# Patient Record
Sex: Male | Born: 1975 | Race: White | Hispanic: Yes | Marital: Single | Smoking: Former smoker
Health system: Southern US, Community
[De-identification: ages and names within clinical notes are randomized; demographics above are authoritative.]

## PROBLEM LIST (undated history)

## (undated) DIAGNOSIS — F419 Anxiety disorder, unspecified: Secondary | ICD-10-CM

## (undated) DIAGNOSIS — F39 Unspecified mood [affective] disorder: Secondary | ICD-10-CM

## (undated) DIAGNOSIS — F32A Depression, unspecified: Secondary | ICD-10-CM

## (undated) DIAGNOSIS — F329 Major depressive disorder, single episode, unspecified: Secondary | ICD-10-CM

## (undated) HISTORY — DX: Depression, unspecified: F32.A

## (undated) HISTORY — PX: APPENDECTOMY: SHX54

## (undated) HISTORY — DX: Unspecified mood (affective) disorder: F39

## (undated) HISTORY — DX: Major depressive disorder, single episode, unspecified: F32.9

## (undated) HISTORY — DX: Anxiety disorder, unspecified: F41.9

---

## 2015-09-04 DIAGNOSIS — F329 Major depressive disorder, single episode, unspecified: Secondary | ICD-10-CM | POA: Diagnosis not present

## 2015-11-20 DIAGNOSIS — F329 Major depressive disorder, single episode, unspecified: Secondary | ICD-10-CM | POA: Diagnosis not present

## 2016-02-15 DIAGNOSIS — F329 Major depressive disorder, single episode, unspecified: Secondary | ICD-10-CM | POA: Diagnosis not present

## 2016-02-20 DIAGNOSIS — F329 Major depressive disorder, single episode, unspecified: Secondary | ICD-10-CM | POA: Diagnosis not present

## 2016-04-16 DIAGNOSIS — F329 Major depressive disorder, single episode, unspecified: Secondary | ICD-10-CM | POA: Diagnosis not present

## 2016-04-18 DIAGNOSIS — E119 Type 2 diabetes mellitus without complications: Secondary | ICD-10-CM | POA: Diagnosis not present

## 2016-04-18 DIAGNOSIS — I10 Essential (primary) hypertension: Secondary | ICD-10-CM | POA: Diagnosis not present

## 2016-04-18 DIAGNOSIS — J309 Allergic rhinitis, unspecified: Secondary | ICD-10-CM | POA: Diagnosis not present

## 2016-04-18 DIAGNOSIS — D509 Iron deficiency anemia, unspecified: Secondary | ICD-10-CM | POA: Diagnosis not present

## 2016-06-17 DIAGNOSIS — F329 Major depressive disorder, single episode, unspecified: Secondary | ICD-10-CM | POA: Diagnosis not present

## 2016-07-07 DIAGNOSIS — Z9119 Patient's noncompliance with other medical treatment and regimen: Secondary | ICD-10-CM | POA: Diagnosis not present

## 2016-07-07 DIAGNOSIS — F250 Schizoaffective disorder, bipolar type: Secondary | ICD-10-CM | POA: Diagnosis not present

## 2016-07-07 DIAGNOSIS — F419 Anxiety disorder, unspecified: Secondary | ICD-10-CM | POA: Diagnosis not present

## 2016-07-18 DIAGNOSIS — F3112 Bipolar disorder, current episode manic without psychotic features, moderate: Secondary | ICD-10-CM | POA: Diagnosis not present

## 2016-07-26 DIAGNOSIS — Z9119 Patient's noncompliance with other medical treatment and regimen: Secondary | ICD-10-CM | POA: Diagnosis not present

## 2016-07-26 DIAGNOSIS — Z136 Encounter for screening for cardiovascular disorders: Secondary | ICD-10-CM | POA: Diagnosis not present

## 2016-07-26 DIAGNOSIS — F250 Schizoaffective disorder, bipolar type: Secondary | ICD-10-CM | POA: Diagnosis not present

## 2016-07-26 DIAGNOSIS — G4700 Insomnia, unspecified: Secondary | ICD-10-CM | POA: Diagnosis not present

## 2016-07-26 DIAGNOSIS — F419 Anxiety disorder, unspecified: Secondary | ICD-10-CM | POA: Diagnosis not present

## 2016-08-17 DIAGNOSIS — F209 Schizophrenia, unspecified: Secondary | ICD-10-CM | POA: Diagnosis not present

## 2016-08-17 DIAGNOSIS — Z9114 Patient's other noncompliance with medication regimen: Secondary | ICD-10-CM | POA: Diagnosis not present

## 2016-08-17 DIAGNOSIS — F250 Schizoaffective disorder, bipolar type: Secondary | ICD-10-CM | POA: Diagnosis present

## 2016-08-17 DIAGNOSIS — R824 Acetonuria: Secondary | ICD-10-CM | POA: Diagnosis not present

## 2016-09-09 DIAGNOSIS — F329 Major depressive disorder, single episode, unspecified: Secondary | ICD-10-CM | POA: Diagnosis not present

## 2016-09-10 DIAGNOSIS — F329 Major depressive disorder, single episode, unspecified: Secondary | ICD-10-CM | POA: Diagnosis not present

## 2016-09-15 DIAGNOSIS — F329 Major depressive disorder, single episode, unspecified: Secondary | ICD-10-CM | POA: Diagnosis not present

## 2016-09-19 DIAGNOSIS — F329 Major depressive disorder, single episode, unspecified: Secondary | ICD-10-CM | POA: Diagnosis not present

## 2016-09-22 DIAGNOSIS — F329 Major depressive disorder, single episode, unspecified: Secondary | ICD-10-CM | POA: Diagnosis not present

## 2016-09-23 DIAGNOSIS — F329 Major depressive disorder, single episode, unspecified: Secondary | ICD-10-CM | POA: Diagnosis not present

## 2016-09-25 DIAGNOSIS — F329 Major depressive disorder, single episode, unspecified: Secondary | ICD-10-CM | POA: Diagnosis not present

## 2016-09-26 DIAGNOSIS — F329 Major depressive disorder, single episode, unspecified: Secondary | ICD-10-CM | POA: Diagnosis not present

## 2016-09-29 DIAGNOSIS — F329 Major depressive disorder, single episode, unspecified: Secondary | ICD-10-CM | POA: Diagnosis not present

## 2016-10-01 DIAGNOSIS — F329 Major depressive disorder, single episode, unspecified: Secondary | ICD-10-CM | POA: Diagnosis not present

## 2016-10-03 DIAGNOSIS — F329 Major depressive disorder, single episode, unspecified: Secondary | ICD-10-CM | POA: Diagnosis not present

## 2016-10-06 DIAGNOSIS — F329 Major depressive disorder, single episode, unspecified: Secondary | ICD-10-CM | POA: Diagnosis not present

## 2016-10-13 DIAGNOSIS — F329 Major depressive disorder, single episode, unspecified: Secondary | ICD-10-CM | POA: Diagnosis not present

## 2016-10-15 DIAGNOSIS — F329 Major depressive disorder, single episode, unspecified: Secondary | ICD-10-CM | POA: Diagnosis not present

## 2016-10-16 DIAGNOSIS — F329 Major depressive disorder, single episode, unspecified: Secondary | ICD-10-CM | POA: Diagnosis not present

## 2016-10-30 DIAGNOSIS — F329 Major depressive disorder, single episode, unspecified: Secondary | ICD-10-CM | POA: Diagnosis not present

## 2017-01-02 DIAGNOSIS — F209 Schizophrenia, unspecified: Secondary | ICD-10-CM | POA: Diagnosis not present

## 2017-01-13 DIAGNOSIS — F250 Schizoaffective disorder, bipolar type: Secondary | ICD-10-CM | POA: Diagnosis not present

## 2017-01-13 DIAGNOSIS — E559 Vitamin D deficiency, unspecified: Secondary | ICD-10-CM | POA: Diagnosis not present

## 2017-01-13 DIAGNOSIS — Z79899 Other long term (current) drug therapy: Secondary | ICD-10-CM | POA: Diagnosis not present

## 2017-01-13 DIAGNOSIS — F209 Schizophrenia, unspecified: Secondary | ICD-10-CM | POA: Diagnosis not present

## 2017-01-13 DIAGNOSIS — F3112 Bipolar disorder, current episode manic without psychotic features, moderate: Secondary | ICD-10-CM | POA: Diagnosis not present

## 2017-01-15 DIAGNOSIS — F209 Schizophrenia, unspecified: Secondary | ICD-10-CM | POA: Diagnosis not present

## 2017-01-22 DIAGNOSIS — F209 Schizophrenia, unspecified: Secondary | ICD-10-CM | POA: Diagnosis not present

## 2017-01-28 DIAGNOSIS — F209 Schizophrenia, unspecified: Secondary | ICD-10-CM | POA: Diagnosis not present

## 2017-01-28 DIAGNOSIS — F250 Schizoaffective disorder, bipolar type: Secondary | ICD-10-CM | POA: Diagnosis not present

## 2017-02-19 DIAGNOSIS — F250 Schizoaffective disorder, bipolar type: Secondary | ICD-10-CM | POA: Diagnosis not present

## 2017-03-24 DIAGNOSIS — F250 Schizoaffective disorder, bipolar type: Secondary | ICD-10-CM | POA: Diagnosis not present

## 2017-04-17 DIAGNOSIS — F250 Schizoaffective disorder, bipolar type: Secondary | ICD-10-CM | POA: Diagnosis not present

## 2017-05-13 DIAGNOSIS — F250 Schizoaffective disorder, bipolar type: Secondary | ICD-10-CM | POA: Diagnosis not present

## 2017-06-10 DIAGNOSIS — F250 Schizoaffective disorder, bipolar type: Secondary | ICD-10-CM | POA: Diagnosis not present

## 2017-09-02 DIAGNOSIS — F250 Schizoaffective disorder, bipolar type: Secondary | ICD-10-CM | POA: Diagnosis not present

## 2017-09-15 DIAGNOSIS — Z79899 Other long term (current) drug therapy: Secondary | ICD-10-CM | POA: Diagnosis not present

## 2017-09-15 DIAGNOSIS — E559 Vitamin D deficiency, unspecified: Secondary | ICD-10-CM | POA: Diagnosis not present

## 2017-09-15 DIAGNOSIS — F250 Schizoaffective disorder, bipolar type: Secondary | ICD-10-CM | POA: Diagnosis not present

## 2017-09-30 DIAGNOSIS — F250 Schizoaffective disorder, bipolar type: Secondary | ICD-10-CM | POA: Diagnosis not present

## 2017-10-28 DIAGNOSIS — F250 Schizoaffective disorder, bipolar type: Secondary | ICD-10-CM | POA: Diagnosis not present

## 2017-11-26 DIAGNOSIS — F250 Schizoaffective disorder, bipolar type: Secondary | ICD-10-CM | POA: Diagnosis not present

## 2017-12-10 ENCOUNTER — Encounter: Payer: Self-pay | Admitting: Physician Assistant

## 2017-12-10 ENCOUNTER — Ambulatory Visit (INDEPENDENT_AMBULATORY_CARE_PROVIDER_SITE_OTHER): Payer: Medicare Other | Admitting: Physician Assistant

## 2017-12-10 VITALS — BP 116/74 | HR 69 | Resp 20 | Ht 67.0 in | Wt 210.0 lb

## 2017-12-10 DIAGNOSIS — Z23 Encounter for immunization: Secondary | ICD-10-CM | POA: Diagnosis not present

## 2017-12-10 DIAGNOSIS — Z1329 Encounter for screening for other suspected endocrine disorder: Secondary | ICD-10-CM | POA: Diagnosis not present

## 2017-12-10 DIAGNOSIS — Z130 Encounter for screening for diseases of the blood and blood-forming organs and certain disorders involving the immune mechanism: Secondary | ICD-10-CM

## 2017-12-10 DIAGNOSIS — Z13220 Encounter for screening for lipoid disorders: Secondary | ICD-10-CM | POA: Diagnosis not present

## 2017-12-10 DIAGNOSIS — G8929 Other chronic pain: Secondary | ICD-10-CM | POA: Diagnosis not present

## 2017-12-10 DIAGNOSIS — R7989 Other specified abnormal findings of blood chemistry: Secondary | ICD-10-CM | POA: Diagnosis not present

## 2017-12-10 DIAGNOSIS — Z131 Encounter for screening for diabetes mellitus: Secondary | ICD-10-CM | POA: Diagnosis not present

## 2017-12-10 DIAGNOSIS — Z7689 Persons encountering health services in other specified circumstances: Secondary | ICD-10-CM

## 2017-12-10 DIAGNOSIS — F39 Unspecified mood [affective] disorder: Secondary | ICD-10-CM | POA: Diagnosis not present

## 2017-12-10 DIAGNOSIS — M545 Low back pain: Secondary | ICD-10-CM

## 2017-12-10 DIAGNOSIS — Z0000 Encounter for general adult medical examination without abnormal findings: Secondary | ICD-10-CM | POA: Diagnosis not present

## 2017-12-10 DIAGNOSIS — Z5181 Encounter for therapeutic drug level monitoring: Secondary | ICD-10-CM | POA: Insufficient documentation

## 2017-12-10 MED ORDER — MELOXICAM 15 MG PO TABS: 15.0000 mg | ORAL_TABLET | Freq: Every day | ORAL | 1 refills | Status: DC | PRN

## 2017-12-10 MED ORDER — CYCLOBENZAPRINE HCL 5 MG PO TABS: 5.0000 mg | ORAL_TABLET | Freq: Every evening | ORAL | 0 refills | Status: DC | PRN

## 2017-12-10 NOTE — Patient Instructions (Signed)
Dolor de espalda en adultos  (Back Pain, Adult)  El dolor de espalda es muy frecuente. A menudo mejora con el tiempo. La causa del dolor de espalda generalmente no es peligrosa. La mayora de las personas puede aprender a manejar el dolor de espalda por s mismas.  CUIDADOS EN EL HOGAR  Controle su dolor de espalda a fin de detectar algn cambio. Las siguientes indicaciones ayudarn a aliviar cualquier dolor que pueda sentir:   Mantngase activo. Comience con caminatas cortas sobre superficies planas si es posible. Trate de caminar un poco ms cada da.   Haga ejercicios con regularidad tal como le indic el mdico. El ejercicio ayuda a que su espalda se cure ms rpidamente. Tambin ayuda a prevenir futuras lesiones al mantener los msculos fuertes y flexibles.   No se siente, conduzca ni permanezca de pie durante ms de 30 minutos.   No permanezca en la cama. Si hace reposo ms de 1 a 2 das, puede demorar su recuperacin.   Sea cuidadoso al inclinarse o levantar un objeto. Use una tcnica apropiada para levantar peso:  ? Flexione las rodillas.  ? Mantenga el objeto cerca del cuerpo.  ? No gire.   Duerma sobre un colchn firme. Recustese sobre un costado y flexione las rodillas. Si se recuesta sobre la espalda, coloque una almohada debajo de las rodillas.   Tome los medicamentos solamente como se lo haya indicado el mdico.   Aplique hielo sobre la zona lesionada.  ? Ponga el hielo en una bolsa plstica.  ? Coloque una toalla entre la piel y la bolsa de hielo.  ? Deje el hielo durante 20minutos, 2 a 3veces por da, durante los primeros 2 o 3das. Despus de eso, puede alternar entre compresas de hielo y calor.   Evite sentir ansiedad o estrs. Encuentre maneras efectivas de lidiar con el estrs, como hacer ejercicio.   Mantenga un peso saludable. El peso excesivo ejerce tensin sobre la espalda.  SOLICITE AYUDA SI:   Siente dolor que no se alivia con reposo o medicamentos.   Siente cada vez ms  dolor que se extiende a las piernas o los glteos.   El dolor no mejora en una semana.   Siente dolor por la noche.   Pierde peso.   Siente escalofros o fiebre.  SOLICITE AYUDA DE INMEDIATO SI:   No puede controlar su materia fecal (heces) o el pis (orina).   Siente debilidad en las piernas o los brazos.   Siente prdida de la sensibilidad (adormecimiento) en las piernas o los brazos.   Tiene malestar estomacal (nuseas) o vomita.   Siente dolor de estmago (abdominal).   Siente que se desvanece (se desmaya).  Esta informacin no tiene como fin reemplazar el consejo del mdico. Asegrese de hacerle al mdico cualquier pregunta que tenga.  Document Released: 03/03/2011 Document Revised: 09/08/2014 Document Reviewed: 12/20/2013  Elsevier Interactive Patient Education  2018 Elsevier Inc[employer]

## 2017-12-10 NOTE — Progress Notes (Signed)
HPI:                                                                Albert Gould is a 42 y.o. male who presents to [facility]: Primary Care Sports Medicine today to establish care  Spanish speaking patient. [employer] Video Interpreter ID [id number], Sol  Current concerns include: back pain  Back Pain  This is a chronic problem. The current episode started more than 1 month ago. The problem occurs constantly. The problem is unchanged. The pain is present in the lumbar spine. The pain does not radiate. The pain is moderate. The pain is the same all the time. The symptoms are aggravated by bending. Pertinent negatives include no bladder incontinence, bowel incontinence, fever, leg pain, numbness, paresis, paresthesias, perianal numbness, tingling or weakness. Risk factors include sedentary lifestyle and obesity. He has tried analgesics (topical muscle gel) for the symptoms. The treatment provided no relief.   Patient is a poor historian. Reports he takes 2 medications for mood, but does not know the name of his psychiatrist, the name of the practice or the names of the medications.    Dep[facility] 12/10/2017  Decreased Interest 0  Down, Depressed, Hopeless 0  PHQ - 2 Score 0  Altered sleeping 1  Tired, decreased energy 0  Change in appetite 0  Feeling bad or failure about yourself  1  Trouble concentrating 0  Moving slowly or fidgety/restless 0  Suicidal thoughts 0  PHQ-9 Score 2    GAD 7 : Generalized Anxiety Score 12/10/2017  Nervous, Anxious, on Edge 0  Control/stop worrying 0  Worry too much - different things 1  Trouble relaxing 0  Restless 0  Easily annoyed or irritable 0  Afraid - awful might happen 0  Total GAD 7 Score 1      Past Medical History:  Diagnosis Date  . Anxiety   . Depression   . Mood disorder [facility](HCC)    Past Surgical History:  Procedure Laterality Date  . APPENDECTOMY     Social History   Tobacco Use  . Smoking  status: Never Smoker  . Smokeless tobacco: Never Used  Substance Use Topics  . Alcohol use: Never    Frequency: Never   Family history is unknown by patient.    ROS: negative except as noted in the HPI  Medications: Current Outpatient Medications  Medication Sig Dispense Refill  . cyclobenzaprine (FLEXERIL) 5 MG tablet Take 1 tablet (5 mg total) by mouth at bedtime as needed for muscle spasms (back pain). 30 tablet 0  . divalproex (DEPAKOTE ER) 500 MG 24 hr tablet Take 500 mg by mouth daily.    . meloxicam (MOBIC) 15 MG tablet Take 1 tablet (15 mg total) by mouth daily as needed (back pain). 30 tablet 1   No current facility-administered medications for this visit.    No Known Allergies     Objective:  BP 116/74   Pulse 69   Resp 20   Ht 5\' 7"  (1.702 m)   Wt 210 lb (95.3 kg)   BMI 32.89 kg/m  Gen:  alert, appears anxious, not ill-appearing, no distress, appropriate for age, obese male HEENT: head normocephalic without obvious abnormality, conjunctiva and cornea clear, trachea midline  Pulm: Normal work of breathing, normal phonation Neuro: alert and oriented x 3, DTR's intact MSK: back atraumatic, no spinous process tenderness, tender over the sacrum, positive SLR on the right, strength 5/5 symmetric in bilateral lower extremities, extremities atraumatic, normal gait and station Skin: intact, no rashes on exposed skin, no jaundice, no cyanosis Psych: appearance, cooperative, good eye contact, euthymic mood, psychomotor agitation, speech is articulate, and thought processes clear and goal-directed    No results found for this or any previous visit (from the past 72 hour(s)). No results found.    Assessment and Plan: 42 y.o. male with  Chronic midline low back pain w/o sciatica - Lspine x-rays today due to >6 month duration of symptoms - no red flag symptoms. Reassuring physical exam, no focal deficits - conservative management with PT and NSAID - follow-up with  Sports medicine in 6 weeks  Mood disorder - [facility] reports patient has not filled medication since October 2017. Last prescriber was Dr. Lynnell Jude in [place], [place]. Requesting records - checking Depakote level today - referring to Psychiatry for management  Encounter to establish care  Screening for blood disease - Plan: CBC, COMPLETE METABOLIC PANEL WITH GFR  Screening for lipid disorders - Plan: Lipid Panel w/reflex Direct LDL  Screening for diabetes mellitus (DM) - Plan: Hemoglobin A1c  Screening for thyroid disorder - Plan: TSH + free T4  Chronic midline low back pain without sciatica - Plan: meloxicam (MOBIC) 15 MG tablet, cyclobenzaprine (FLEXERIL) 5 MG tablet, Ambulatory referral to Physical Therapy, DG Lumbar Spine Complete  Mood disorder (HCC) - Plan: Ambulatory referral to Psychiatry  Need for Tdap vaccination - Plan: Tdap vaccine greater than or equal to 7yo IM  Encounter for medication monitoring - Plan: Valproic Acid level   Patient education and anticipatory guidance given Patient agrees with treatment plan Follow-up as needed if symptoms worsen or fail to improve  Levonne Hubert PA-C

## 2017-12-11 LAB — COMPLETE METABOLIC PANEL WITH GFR
AG Ratio: 1.9 (calc) (ref 1.0–2.5)
ALKALINE PHOSPHATASE (APISO): 71 U/L (ref 40–115)
ALT: 28 U/L (ref 9–46)
AST: 22 U/L (ref 10–40)
Albumin: 4.5 g/dL (ref 3.6–5.1)
BILIRUBIN TOTAL: 0.4 mg/dL (ref 0.2–1.2)
BUN: 17 mg/dL (ref 7–25)
CHLORIDE: 104 mmol/L (ref 98–110)
CO2: 29 mmol/L (ref 20–32)
Calcium: 9.4 mg/dL (ref 8.6–10.3)
Creat: 0.94 mg/dL (ref 0.60–1.35)
GFR, Est African American: 117 mL/min/{1.73_m2} (ref >=60)
GFR, Est Non African American: 101 mL/min/{1.73_m2} (ref >=60)
GLUCOSE: 94 mg/dL (ref 65–99)
Globulin: 2.4 g/dL (calc) (ref 1.9–3.7)
Potassium: 4 mmol/L (ref 3.5–5.3)
Sodium: 140 mmol/L (ref 135–146)
TOTAL PROTEIN: 6.9 g/dL (ref 6.1–8.1)

## 2017-12-11 LAB — CBC
HCT: 47.5 % (ref 38.5–50.0)
Hemoglobin: 15.9 g/dL (ref 13.2–17.1)
MCH: 26.7 pg — ABNORMAL LOW (ref 27.0–33.0)
MCHC: 33.5 g/dL (ref 32.0–36.0)
MCV: 79.8 fL — ABNORMAL LOW (ref 80.0–100.0)
MPV: 10 fL (ref 7.5–12.5)
PLATELETS: 242 10*3/uL (ref 140–400)
RBC: 5.95 10*6/uL — AB (ref 4.20–5.80)
RDW: 13.7 % (ref 11.0–15.0)
WBC: 6.7 10*3/uL (ref 3.8–10.8)

## 2017-12-11 LAB — VALPROIC ACID LEVEL: Valproic Acid Lvl: 16.7 mg/L — ABNORMAL LOW (ref 50.0–100.0)

## 2017-12-11 LAB — LIPID PANEL W/REFLEX DIRECT LDL
Cholesterol: 167 mg/dL (ref <200)
HDL: 49 mg/dL (ref >40)
LDL CHOLESTEROL (CALC): 98 mg/dL
Non-HDL Cholesterol (Calc): 118 mg/dL (calc) (ref <130)
TRIGLYCERIDES: 106 mg/dL (ref <150)
Total CHOL/HDL Ratio: 3.4 (calc) (ref <5.0)

## 2017-12-11 LAB — HEMOGLOBIN A1C
Hgb A1c MFr Bld: 5.5 % of total Hgb (ref <5.7)
Mean Plasma Glucose: 111 (calc)
eAG (mmol/L): 6.2 (calc)

## 2017-12-11 LAB — TSH+FREE T4: TSH W/REFLEX TO FT4: 2.48 mIU/L (ref 0.40–4.50)

## 2017-12-11 NOTE — Progress Notes (Signed)
Depakote level is low. [facility] Other labs look good - normal kidney function - cholesterol in a healthy range - normal blood counts - no evidence of diabetes - no evidence of thyroid disease

## 2017-12-23 ENCOUNTER — Ambulatory Visit: Payer: Medicare Other | Admitting: Physical Therapy

## 2017-12-24 DIAGNOSIS — F250 Schizoaffective disorder, bipolar type: Secondary | ICD-10-CM | POA: Diagnosis not present

## 2018-01-21 ENCOUNTER — Encounter: Payer: Self-pay | Admitting: Family Medicine

## 2018-01-21 DIAGNOSIS — F250 Schizoaffective disorder, bipolar type: Secondary | ICD-10-CM | POA: Diagnosis not present

## 2018-02-18 DIAGNOSIS — F250 Schizoaffective disorder, bipolar type: Secondary | ICD-10-CM | POA: Diagnosis not present

## 2018-03-19 DIAGNOSIS — F250 Schizoaffective disorder, bipolar type: Secondary | ICD-10-CM | POA: Diagnosis not present

## 2018-04-15 DIAGNOSIS — F250 Schizoaffective disorder, bipolar type: Secondary | ICD-10-CM | POA: Diagnosis not present

## 2018-05-13 DIAGNOSIS — F250 Schizoaffective disorder, bipolar type: Secondary | ICD-10-CM | POA: Diagnosis not present

## 2018-06-21 ENCOUNTER — Ambulatory Visit (INDEPENDENT_AMBULATORY_CARE_PROVIDER_SITE_OTHER): Payer: Medicare Other

## 2018-06-21 ENCOUNTER — Encounter: Payer: Self-pay | Admitting: Physician Assistant

## 2018-06-21 ENCOUNTER — Ambulatory Visit (INDEPENDENT_AMBULATORY_CARE_PROVIDER_SITE_OTHER): Payer: Medicare Other | Admitting: Physician Assistant

## 2018-06-21 VITALS — BP 139/80 | HR 79 | Temp 98.1°F | Resp 20 | Wt 217.0 lb

## 2018-06-21 DIAGNOSIS — R0602 Shortness of breath: Secondary | ICD-10-CM | POA: Diagnosis not present

## 2018-06-21 DIAGNOSIS — H9201 Otalgia, right ear: Secondary | ICD-10-CM | POA: Diagnosis not present

## 2018-06-21 DIAGNOSIS — F411 Generalized anxiety disorder: Secondary | ICD-10-CM | POA: Diagnosis not present

## 2018-06-21 LAB — CBC WITH DIFFERENTIAL/PLATELET
BASOS ABS: 43 {cells}/uL (ref 0–200)
Basophils Relative: 0.6 %
EOS ABS: 482 {cells}/uL (ref 15–500)
Eosinophils Relative: 6.7 %
HEMATOCRIT: 45.1 % (ref 38.5–50.0)
Hemoglobin: 15.2 g/dL (ref 13.2–17.1)
LYMPHS ABS: 2290 {cells}/uL (ref 850–3900)
MCH: 27.2 pg (ref 27.0–33.0)
MCHC: 33.7 g/dL (ref 32.0–36.0)
MCV: 80.8 fL (ref 80.0–100.0)
MPV: 9.9 fL (ref 7.5–12.5)
Monocytes Relative: 8.3 %
Neutro Abs: 3787 cells/uL (ref 1500–7800)
Neutrophils Relative %: 52.6 %
Platelets: 212 10*3/uL (ref 140–400)
RBC: 5.58 10*6/uL (ref 4.20–5.80)
RDW: 13.5 % (ref 11.0–15.0)
TOTAL LYMPHOCYTE: 31.8 %
WBC: 7.2 10*3/uL (ref 3.8–10.8)
WBCMIX: 598 {cells}/uL (ref 200–950)

## 2018-06-21 LAB — BRAIN NATRIURETIC PEPTIDE: BRAIN NATRIURETIC PEPTIDE: 10 pg/mL (ref <100)

## 2018-06-21 LAB — TSH+FREE T4: TSH W/REFLEX TO FT4: 1.91 mIU/L (ref 0.40–4.50)

## 2018-06-21 LAB — D-DIMER, QUANTITATIVE (NOT AT ARMC)

## 2018-06-21 NOTE — Patient Instructions (Addendum)
Please go downstairs for chest x-ray today and then go to [facility] for labs. Both are located on the first floor of this building After your imaging and lab work, please stop in [facility] department to schedule appointment with Psychiatry to manage your medications   Falta de aire (Shortness of Breath) Falta de aire significa que tiene dificultad para respirar. Es necesario que reciba atencin mdica de inmediato. CUIDADOS EN EL HOGAR  No fume.  Evite estar cerca de sustancias qumicas (vapores de pintura, polvo) que puedan dificultar su respiracin.  Descanse todo lo que sea necesario. Retome lentamente sus actividades normales.  Tome solo los medicamentos segn le haya indicado el mdico.  Cumpla con las visitas al mdico segn las indicaciones.  SOLICITE AYUDA DE INMEDIATO SI:  La falta de aire empeora.  Tiene mareos, pierde el conocimiento (se desmaya) o tiene tos que no mejora con medicamentos.  Tose y escupe sangre.  Siente dolor al respirar.  Tiene dolor en el pecho, los brazos, los hombros o el vientre (abdomen).  Tiene fiebre.  No puede subir escaleras o realizar ejercicio del modo en que lo haca habitualmente.  No mejora como se esperaba.  Le cuesta hacer las actividades normales, aun si ha descansado lo suficiente.  Tiene problemas con los medicamentos.  Aparece algn sntoma nuevo.  ASEGRESE DE QUE:  Comprende estas instrucciones.  Controlar su afeccin.  Recibir ayuda de inmediato si no mejora o si empeora.  Esta informacin no tiene Theme park manager el consejo del mdico. Asegrese de hacerle al mdico cualquier pregunta que tenga. Document Released: 02/05/2010 Document Revised: 08/23/2013 Document Reviewed: 01/24/2016 Elsevier Interactive Patient Education  2017 [employer].

## 2018-06-21 NOTE — Progress Notes (Signed)
HPI:                                                                Albert Gould is a 42 y.o. male who presents to [facility]: Primary Care Sports Medicine today for sob  [employer] Interpreter [phone]  Shortness of Breath  This is a new problem. The current episode started 1 to 4 weeks ago (approx 1 month). The problem occurs constantly. The problem has been unchanged. Associated symptoms include chest pain (tightness), ear pain and a sore throat. Pertinent negatives include no coryza, fever, hemoptysis, leg swelling, orthopnea, PND, sputum production, syncope or wheezing. The symptoms are aggravated by any activity. Associated symptoms comments: + stress. He has tried nothing for the symptoms. There is no history of allergies, chronic lung disease, COPD or pneumonia.     Past Medical History:  Diagnosis Date  . Anxiety   . Depression   . Mood disorder [facility])    Past Surgical History:  Procedure Laterality Date  . APPENDECTOMY     Social History   Tobacco Use  . Smoking status: Never Smoker  . Smokeless tobacco: Never Used  Substance Use Topics  . Alcohol use: Never    Frequency: Never   Family history is unknown by patient.    ROS: Review of Systems  Constitutional: Negative for chills, diaphoresis, fever, malaise/fatigue and weight loss.  HENT: Positive for ear pain and sore throat.   Respiratory: Positive for shortness of breath. Negative for hemoptysis, sputum production and wheezing.   Cardiovascular: Positive for chest pain (tightness). Negative for palpitations, orthopnea, leg swelling, syncope and PND.  Neurological: Negative for dizziness, loss of consciousness and weakness.  Psychiatric/Behavioral: The patient is nervous/anxious.        + mood disorder  All other systems reviewed and are negative.    Medications: Current Outpatient Medications  Medication Sig Dispense Refill  . divalproex (DEPAKOTE ER) 500 MG 24 hr tablet Take 500 mg by  mouth daily.     No current facility-administered medications for this visit.    No Known Allergies     Objective:  BP 139/80   Pulse 79   Temp 98.1 F (36.7 C) (Oral)   Resp 20   Wt 217 lb (98.4 kg)   SpO2 99%   BMI 33.99 kg/m  Gen:  alert, not ill-appearing, no distress, appropriate for age HEENT: head normocephalic without obvious abnormality, conjunctiva and cornea clear, right TM with sclerosis, no bulging or ertyehma, left TM semi-transparent, oropharynx clear, moist mucuous membranes, no sinus tenderness, neck supple, no cervical adenopathy, trachea midline Pulm: Normal work of breathing, normal phonation, clear to auscultation bilaterally, no wheezes, rales or rhonchi CV: Normal rate, regular rhythm, s1 and s2 distinct, no murmurs, clicks or rubs  Neuro: alert and oriented x 3, no tremor MSK: extremities atraumatic, normal gait and station Skin: intact, no rashes on exposed skin, no jaundice, no cyanosis Psych: appearance casual, cooperative, good eye contact, appears anxious, there is psychomotor agitation, patient is constantly shaking his left leg, occasional stutter  ECG 06/21/2018 Vent rate 71 bpm PRi 176 ms QRS 84 ms QT/QTc 370/402 Normal sinus rhythm  No results found for this or any previous visit (from the past 72 hour(s)). Dg Chest 2  View  Result Date: 06/21/2018 CLINICAL DATA:  Shortness of breath for 1 week EXAM: CHEST - 2 VIEW COMPARISON:  None. FINDINGS: The heart size and mediastinal contours are within normal limits. Both lungs are clear. The visualized skeletal structures are unremarkable. IMPRESSION: No active cardiopulmonary disease. Electronically Signed   By: Elige Ko   On: 06/21/2018 11:51      Assessment and Plan: 42 y.o. male with   .Iann was seen today for nasal congestion.  Diagnoses and all orders for this visit:  Shortness of breath -     DG Chest 2 View -     CBC with Differential/Platelet -     TSH + free T4 -      D-dimer, quantitative (not at [facility]) -     B Nat Peptide -     EKG 12-Lead  Anxiety state  Acute otalgia, right   SOB - afebrile, no tachypnea, no tachycardia, SpO2 99% on RA at rest, no adventitious lung sounds - ECG NSR - CXR negative for acute cardiopulmonary disease - CBC, TSH, BNP and D-dimer pending - low clinical suspicion for PE, no tachycardia, no chest pain or palpitations, Wells score 0 - patient appears very anxious on exam and admits to being under increased stress. I encouraged close follow-up with his psychiatrist. He states he has an appointment this week. He is again unable to tell me the name or location of his psychiatrist, which is unusual   Patient education and anticipatory guidance given Patient agrees with treatment plan Follow-up in 1 week for spirometry or sooner as needed if symptoms worsen or fail to improve  Levonne Hubert PA-C

## 2018-09-30 DIAGNOSIS — F250 Schizoaffective disorder, bipolar type: Secondary | ICD-10-CM | POA: Diagnosis not present

## 2018-10-14 DIAGNOSIS — F250 Schizoaffective disorder, bipolar type: Secondary | ICD-10-CM | POA: Diagnosis not present

## 2018-11-11 DIAGNOSIS — F250 Schizoaffective disorder, bipolar type: Secondary | ICD-10-CM | POA: Diagnosis not present

## 2018-11-22 ENCOUNTER — Encounter: Payer: Medicare Other | Admitting: Physician Assistant

## 2019-01-06 DIAGNOSIS — F250 Schizoaffective disorder, bipolar type: Secondary | ICD-10-CM | POA: Diagnosis not present

## 2019-01-19 ENCOUNTER — Encounter: Payer: Medicare Other | Admitting: Physician Assistant

## 2019-01-25 ENCOUNTER — Ambulatory Visit: Payer: Medicare Other | Admitting: Physician Assistant

## 2019-02-03 DIAGNOSIS — F250 Schizoaffective disorder, bipolar type: Secondary | ICD-10-CM | POA: Diagnosis not present

## 2019-03-03 DIAGNOSIS — F250 Schizoaffective disorder, bipolar type: Secondary | ICD-10-CM | POA: Diagnosis not present

## 2019-03-31 ENCOUNTER — Telehealth: Payer: Self-pay | Admitting: Physician Assistant

## 2019-03-31 DIAGNOSIS — F250 Schizoaffective disorder, bipolar type: Secondary | ICD-10-CM | POA: Diagnosis not present

## 2019-03-31 DIAGNOSIS — Z79899 Other long term (current) drug therapy: Secondary | ICD-10-CM

## 2019-03-31 DIAGNOSIS — Z5181 Encounter for therapeutic drug level monitoring: Secondary | ICD-10-CM

## 2019-03-31 DIAGNOSIS — Z131 Encounter for screening for diabetes mellitus: Secondary | ICD-10-CM

## 2019-03-31 DIAGNOSIS — Z13220 Encounter for screening for lipoid disorders: Secondary | ICD-10-CM

## 2019-03-31 DIAGNOSIS — Z0000 Encounter for general adult medical examination without abnormal findings: Secondary | ICD-10-CM

## 2019-03-31 NOTE — Telephone Encounter (Signed)
Labs ordered Patient should fast for labs

## 2019-03-31 NOTE — Telephone Encounter (Signed)
Pt's brother, Trilby Drummer called.  His brother is in [place] and they need him get a physical and bloodwork  to check medications he's on.... Depakote and Benzdropine.  The appointment for physical is scheduled for August 6th and he would like lab order sent down prior to the appointment.  Thanks.

## 2019-03-31 NOTE — Telephone Encounter (Signed)
error 

## 2019-04-01 NOTE — Telephone Encounter (Signed)
Patients bother notified and will have him come and get the labs on Monday. No further questions during the call.

## 2019-04-05 DIAGNOSIS — Z79899 Other long term (current) drug therapy: Secondary | ICD-10-CM | POA: Diagnosis not present

## 2019-04-05 DIAGNOSIS — Z131 Encounter for screening for diabetes mellitus: Secondary | ICD-10-CM | POA: Diagnosis not present

## 2019-04-05 DIAGNOSIS — Z5181 Encounter for therapeutic drug level monitoring: Secondary | ICD-10-CM | POA: Diagnosis not present

## 2019-04-05 DIAGNOSIS — Z13220 Encounter for screening for lipoid disorders: Secondary | ICD-10-CM | POA: Diagnosis not present

## 2019-04-05 DIAGNOSIS — Z0000 Encounter for general adult medical examination without abnormal findings: Secondary | ICD-10-CM | POA: Diagnosis not present

## 2019-04-06 LAB — CBC WITH DIFFERENTIAL/PLATELET
Absolute Monocytes: 592 cells/uL (ref 200–950)
Basophils Absolute: 61 cells/uL (ref 0–200)
Basophils Relative: 0.9 %
Eosinophils Absolute: 660 cells/uL — ABNORMAL HIGH (ref 15–500)
Eosinophils Relative: 9.7 %
HCT: 47.1 % (ref 38.5–50.0)
Hemoglobin: 15.4 g/dL (ref 13.2–17.1)
Lymphs Abs: 2924 cells/uL (ref 850–3900)
MCH: 27.2 pg (ref 27.0–33.0)
MCHC: 32.7 g/dL (ref 32.0–36.0)
MCV: 83.2 fL (ref 80.0–100.0)
MPV: 10.5 fL (ref 7.5–12.5)
Monocytes Relative: 8.7 %
Neutro Abs: 2564 cells/uL (ref 1500–7800)
Neutrophils Relative %: 37.7 %
Platelets: 212 10*3/uL (ref 140–400)
RBC: 5.66 10*6/uL (ref 4.20–5.80)
RDW: 14 % (ref 11.0–15.0)
Total Lymphocyte: 43 %
WBC: 6.8 10*3/uL (ref 3.8–10.8)

## 2019-04-06 LAB — HEPATIC FUNCTION PANEL
AG Ratio: 1.6 (calc) (ref 1.0–2.5)
ALT: 28 U/L (ref 9–46)
AST: 21 U/L (ref 10–40)
Albumin: 4 g/dL (ref 3.6–5.1)
Alkaline phosphatase (APISO): 54 U/L (ref 36–130)
Bilirubin, Direct: 0.1 mg/dL (ref 0.0–0.2)
Globulin: 2.5 g/dL (calc) (ref 1.9–3.7)
Indirect Bilirubin: 0.4 mg/dL (calc) (ref 0.2–1.2)
Total Bilirubin: 0.5 mg/dL (ref 0.2–1.2)
Total Protein: 6.5 g/dL (ref 6.1–8.1)

## 2019-04-06 LAB — BASIC METABOLIC PANEL WITH GFR
BUN: 19 mg/dL (ref 7–25)
CO2: 27 mmol/L (ref 20–32)
Calcium: 8.7 mg/dL (ref 8.6–10.3)
Chloride: 107 mmol/L (ref 98–110)
Creat: 0.93 mg/dL (ref 0.60–1.35)
GFR, Est African American: 118 mL/min/{1.73_m2} (ref >=60)
GFR, Est Non African American: 102 mL/min/{1.73_m2} (ref >=60)
Glucose, Bld: 98 mg/dL (ref 65–99)
Potassium: 4.2 mmol/L (ref 3.5–5.3)
Sodium: 141 mmol/L (ref 135–146)

## 2019-04-06 LAB — LIPID PANEL W/REFLEX DIRECT LDL
Cholesterol: 150 mg/dL (ref <200)
HDL: 41 mg/dL (ref >=40)
LDL Cholesterol (Calc): 92 mg/dL (calc)
Non-HDL Cholesterol (Calc): 109 mg/dL (calc) (ref <130)
Total CHOL/HDL Ratio: 3.7 (calc) (ref <5.0)
Triglycerides: 84 mg/dL (ref <150)

## 2019-04-06 LAB — VALPROIC ACID LEVEL: Valproic Acid Lvl: 46.1 mg/L — ABNORMAL LOW (ref 50.0–100.0)

## 2019-04-07 ENCOUNTER — Other Ambulatory Visit: Payer: Self-pay

## 2019-04-07 ENCOUNTER — Ambulatory Visit (INDEPENDENT_AMBULATORY_CARE_PROVIDER_SITE_OTHER): Payer: Medicare Other | Admitting: Physician Assistant

## 2019-04-07 ENCOUNTER — Encounter: Payer: Self-pay | Admitting: Physician Assistant

## 2019-04-07 VITALS — BP 133/82 | HR 96 | Temp 98.3°F | Wt 215.0 lb

## 2019-04-07 DIAGNOSIS — E669 Obesity, unspecified: Secondary | ICD-10-CM | POA: Diagnosis not present

## 2019-04-07 DIAGNOSIS — I10 Essential (primary) hypertension: Secondary | ICD-10-CM

## 2019-04-07 DIAGNOSIS — F259 Schizoaffective disorder, unspecified: Secondary | ICD-10-CM | POA: Diagnosis not present

## 2019-04-07 DIAGNOSIS — Z0000 Encounter for general adult medical examination without abnormal findings: Secondary | ICD-10-CM | POA: Diagnosis not present

## 2019-04-07 DIAGNOSIS — R42 Dizziness and giddiness: Secondary | ICD-10-CM

## 2019-04-07 DIAGNOSIS — Z87898 Personal history of other specified conditions: Secondary | ICD-10-CM | POA: Diagnosis not present

## 2019-04-07 NOTE — Patient Instructions (Addendum)
For your blood pressure: - Goal <130/80 (Ideally 120's/70's) - take your blood pressure medication in the morning (unless instructed differently) - take baby aspirin 81 mg daily to help prevent heart attack/stroke - monitor and log blood pressures at home - check around the same time each day in a relaxed setting - Limit salt to <2500 mg/day - Follow DASH (Dietary Approach to Stopping Hypertension) eating plan - Try to get at least 150 minutes of aerobic exercise per week - Aim to go on a brisk walk 30 minutes per day at least 5 days per week. If you're not active, gradually increase how long you walk by 5 minutes each week - limit alcohol: 2 standard drinks per day for men and 1 per day for women - avoid tobacco/nicotine products. Consider smoking cessation if you smoke - weight loss: 7% of current body weight can reduce your blood pressure by 5-10 points - follow-up at least every 6 months for your blood pressure. Follow-up sooner if your BP is not controlled  Return in 2 weeks for nurse BP check and bring your home BP readings with you   Nonspecific Chest Pain, Adult Chest pain can be caused by many different conditions. It can be caused by a condition that is life-threatening and requires treatment right away. It can also be caused by something that is not life-threatening. If you have chest pain, it can be hard to know the difference, so it is important to get help right away to make sure that you do not have a serious condition. Some life-threatening causes of chest pain include:  Heart attack.  A tear in the body's main blood vessel (aortic dissection).  Inflammation around your heart (pericarditis).  A problem in the lungs, such as a blood clot (pulmonary embolism) or a collapsed lung (pneumothorax). Some non life-threatening causes of chest pain include:  Heartburn.  Anxiety or stress.  Damage to the bones, muscles, and cartilage that make up your chest wall.  Pneumonia  or bronchitis.  Shingles infection (varicella-zoster virus). Chest pain can feel like:  Pain or discomfort on the surface of your chest or deep in your chest.  Crushing, pressure, aching, or squeezing pain.  Burning or tingling.  Dull or sharp pain that is worse when you move, cough, or take a deep breath.  Pain or discomfort that is also felt in your back, neck, jaw, shoulder, or arm, or pain that spreads to any of these areas. Your chest pain may come and go. It may also be constant. Your health care provider will do lab tests and other studies to find the cause of your pain. Treatment will depend on the cause of your chest pain. Follow these instructions at home: Medicines  Take over-the-counter and prescription medicines only as told by your health care provider.  If you were prescribed an antibiotic, take it as told by your health care provider. Do not stop taking the antibiotic even if you start to feel better. Lifestyle   Rest as directed by your health care provider.  Do not use any products that contain nicotine or tobacco, such as cigarettes and e-cigarettes. If you need help quitting, ask your health care provider.  Do not drink alcohol.  Make healthy lifestyle choices as recommended. These may include: ? Getting regular exercise. Ask your health care provider to suggest some activities that are safe for you. ? Eating a heart-healthy diet. This includes plenty of fresh fruits and vegetables, whole grains, low-fat (lean) protein,  and low-fat dairy products. A dietitian can help you find healthy eating options. ? Maintaining a healthy weight. ? Managing any other health conditions you have, such as high blood pressure (hypertension) or diabetes. ? Reducing stress, such as with yoga or relaxation techniques. General instructions  Pay attention to any changes in your symptoms. Tell your health care provider about them or any new symptoms.  Avoid any activities that  cause chest pain.  Keep all follow-up visits as told by your health care provider. This is important. This includes visits for any further testing if your chest pain does not go away. Contact a health care provider if:  Your chest pain does not go away.  You feel depressed.  You have a fever. Get help right away if:  Your chest pain gets worse.  You have a cough that gets worse, or you cough up blood.  You have severe pain in your abdomen.  You faint.  You have sudden, unexplained chest discomfort.  You have sudden, unexplained discomfort in your arms, back, neck, or jaw.  You have shortness of breath at any time.  You suddenly start to sweat, or your skin gets clammy.  You feel nausea or you vomit.  You suddenly feel lightheaded or dizzy.  You have severe weakness, or unexplained weakness or fatigue.  Your heart begins to beat quickly, or it feels like it is skipping beats. These symptoms may represent a serious problem that is an emergency. Do not wait to see if the symptoms will go away. Get medical help right away. Call your local emergency services (911 in the [place] Do not drive yourself to the hospital. Summary  Chest pain can be caused by a condition that is serious and requires urgent treatment. It may also be caused by something that is not life-threatening.  If you have chest pain, it is very important to see your health care provider. Your health care provider may do lab tests and other studies to find the cause of your pain.  Follow your health care provider's instructions on taking medicines, making lifestyle changes, and getting emergency treatment if symptoms become worse.  Keep all follow-up visits as told by your health care provider. This includes visits for any further testing if your chest pain does not go away. This information is not intended to replace advice given to you by your health care provider. Make sure you discuss any questions you have  with your health care provider. Document Released: 05/28/2005 Document Revised: 02/18/2018 Document Reviewed: 02/18/2018 Elsevier Patient Education  2020 [employer]   Preventive Care 39-73 Years Old, Male Preventive care refers to lifestyle choices and visits with your health care provider that can promote health and wellness. This includes:  A yearly physical exam. This is also called an annual well check.  Regular dental and eye exams.  Immunizations.  Screening for certain conditions.  Healthy lifestyle choices, such as eating a healthy diet, getting regular exercise, not using drugs or products that contain nicotine and tobacco, and limiting alcohol use. What can I expect for my preventive care visit? Physical exam Your health care provider will check:  Height and weight. These may be used to calculate body mass index (BMI), which is a measurement that tells if you are at a healthy weight.  Heart rate and blood pressure.  Your skin for abnormal spots. Counseling Your health care provider may ask you questions about:  Alcohol, tobacco, and drug use.  Emotional well-being.  Home  and relationship well-being.  Sexual activity.  Eating habits.  Work and work Statistician. What immunizations do I need?  Influenza (flu) vaccine  This is recommended every year. Tetanus, diphtheria, and pertussis (Tdap) vaccine  You may need a Td booster every 10 years. Varicella (chickenpox) vaccine  You may need this vaccine if you have not already been vaccinated. Zoster (shingles) vaccine  You may need this after age 50. Measles, mumps, and rubella (MMR) vaccine  You may need at least one dose of MMR if you were born in 1957 or later. You may also need a second dose. Pneumococcal conjugate (PCV13) vaccine  You may need this if you have certain conditions and were not previously vaccinated. Pneumococcal polysaccharide (PPSV23) vaccine  You may need one or two doses if  you smoke cigarettes or if you have certain conditions. Meningococcal conjugate (MenACWY) vaccine  You may need this if you have certain conditions. Hepatitis A vaccine  You may need this if you have certain conditions or if you travel or work in places where you may be exposed to hepatitis A. Hepatitis B vaccine  You may need this if you have certain conditions or if you travel or work in places where you may be exposed to hepatitis B. Haemophilus influenzae type b (Hib) vaccine  You may need this if you have certain risk factors. Human papillomavirus (HPV) vaccine  If recommended by your health care provider, you may need three doses over 6 months. You may receive vaccines as individual doses or as more than one vaccine together in one shot (combination vaccines). Talk with your health care provider about the risks and benefits of combination vaccines. What tests do I need? Blood tests  Lipid and cholesterol levels. These may be checked every 5 years, or more frequently if you are over 25 years old.  Hepatitis C test.  Hepatitis B test. Screening  Lung cancer screening. You may have this screening every year starting at age 40 if you have a 30-pack-year history of smoking and currently smoke or have quit within the past 15 years.  Prostate cancer screening. Recommendations will vary depending on your family history and other risks.  Colorectal cancer screening. All adults should have this screening starting at age 59 and continuing until age 69. Your health care provider may recommend screening at age 37 if you are at increased risk. You will have tests every 1-10 years, depending on your results and the type of screening test.  Diabetes screening. This is done by checking your blood sugar (glucose) after you have not eaten for a while (fasting). You may have this done every 1-3 years.  Sexually transmitted disease (STD) testing. Follow these instructions at home: Eating and  drinking  Eat a diet that includes fresh fruits and vegetables, whole grains, lean protein, and low-fat dairy products.  Take vitamin and mineral supplements as recommended by your health care provider.  Do not drink alcohol if your health care provider tells you not to drink.  If you drink alcohol: ? Limit how much you have to 0-2 drinks a day. ? Be aware of how much alcohol is in your drink. In the [place] one drink equals one 12 oz bottle of beer (355 mL), one 5 oz glass of wine (148 mL), or one 1 oz glass of hard liquor (44 mL). Lifestyle  Take daily care of your teeth and gums.  Stay active. Exercise for at least 30 minutes on 5 or more days each  week.  Do not use any products that contain nicotine or tobacco, such as cigarettes, e-cigarettes, and chewing tobacco. If you need help quitting, ask your health care provider.  If you are sexually active, practice safe sex. Use a condom or other form of protection to prevent STIs (sexually transmitted infections).  Talk with your health care provider about taking a low-dose aspirin every day starting at age 2. What's next?  Go to your health care provider once a year for a well check visit.  Ask your health care provider how often you should have your eyes and teeth checked.  Stay up to date on all vaccines. This information is not intended to replace advice given to you by your health care provider. Make sure you discuss any questions you have with your health care provider. Document Released: 09/14/2015 Document Revised: 08/12/2018 Document Reviewed: 08/12/2018 Elsevier Patient Education  2020 [employer].

## 2019-04-07 NOTE — Progress Notes (Signed)
HPI:                                                                Albert Gould is a 42 y.o. male who [facility] today for annual physical exam  Current Concerns include: chest pain  Patient reports having a right sided chest pain 2 weeks ago while helping a friend move a piece of furniture.  He states it felt like pressure.  He states it went away on its own after less than an hour and has not recurred.  There was no associated dizziness, lightheadedness, palpitations, diaphoresis.  He does endorse occasional lightheadedness where he states he feels "like I am out of it." He does not exercise, but denies chest pain with walking uphill or up stairs. Reports occasional tobacco use, approx 1 cigarette per week. Denies alcohol or illicit drug use.  Health Maintenance Health Maintenance  Topic Date Due  . HIV Screening  05/15/1992  . INFLUENZA VACCINE  04/02/2019  . TETANUS/TDAP  12/11/2027     Past Medical History:  Diagnosis Date  . Anxiety   . Depression   . Mood disorder [facility](HCC)    Past Surgical History:  Procedure Laterality Date  . APPENDECTOMY     Social History   Tobacco Use  . Smoking status: Current Some Day Smoker    Types: Cigarettes  . Smokeless tobacco: Never Used  . Tobacco comment: 1 cig/week  Substance Use Topics  . Alcohol use: Never    Frequency: Never   family history includes Healthy in his father and mother.  ROS: negative except as noted in the HPI  Medications: Current Outpatient Medications  Medication Sig Dispense Refill  . benztropine (COGENTIN) 1 MG tablet     . divalproex (DEPAKOTE ER) 500 MG 24 hr tablet Take 500 mg by mouth daily.    Hinda Glatter. INVEGA SUSTENNA 234 MG/1.5ML SUSY injection      No current facility-administered medications for this visit.    No Known Allergies     Objective:  BP 133/82   Pulse 96   Temp 98.3 F (36.8 C) (Oral)   Wt 215 lb (97.5 kg)   BMI  33.67 kg/m  Wt Readings from Last 3 Encounters:  04/07/19 215 lb (97.5 kg)  06/21/18 217 lb (98.4 kg)  12/10/17 210 lb (95.3 kg)   Temp Readings from Last 3 Encounters:  04/07/19 98.3 F (36.8 C) (Oral)  06/21/18 98.1 F (36.7 C) (Oral)   BP Readings from Last 3 Encounters:  04/07/19 133/82  06/21/18 139/80  12/10/17 116/74   Pulse Readings from Last 3 Encounters:  04/07/19 96  06/21/18 79  12/10/17 69    General Appearance:  Alert, cooperative, no distress, appropriate for age, obese male                            Head:  Normocephalic, without obvious abnormality                             Eyes:  PERRL, EOM's intact, conjunctiva and cornea clear  Ears:  TM pearly gray color and semitransparent, external ear canals normal, both ears                            Nose:  Nares symmetrical, mucosa pink                          Throat:  Lips, tongue, and mucosa are moist, pink, and intact; oropharynx clear, uvula midline; good dentition                             Neck:  Supple; symmetrical, trachea midline, no adenopathy; thyroid: no enlargement, symmetric, no tenderness/mass/nodules                             Back:  Symmetrical, no curvature, ROM normal                                         Lungs:  Clear to auscultation bilaterally, respirations unlabored                             Heart:  normal rate & regular rhythm, S1 and S2 normal, no murmurs, rubs, or gallops                     Abdomen:  Soft, non-tender, no mass or organomegaly              Genitourinary:  deferred         Musculoskeletal:  Tone and strength strong and symmetrical, all extremities; no joint pain or edema, normal gait and station                                   Lymphatic:  No adenopathy             Skin/Hair/Nails:  Skin warm, dry and intact, no rashes or abnormal dyspigmentation on limited exam                   Neurologic:  Alert and oriented x3, no cranial nerve  deficits, sensation grossly intact, normal gait and station, no tremor Psych: well-groomed, cooperative, good eye contact, euthymic mood, affect mood-congruent, speech is articulate, and thought processes clear and goal-directed   No results found for this or any previous visit (from the past 72 hour(s)). No results found.  The 10-year ASCVD risk score Denman George(Goff DC Montez HagemanJr., et al., 2013) is: 3.2%   Values used to calculate the score:     Age: [age] years     Sex: Male     Is Non-Hispanic African American: No     Diabetic: No     Tobacco smoker: Yes     Systolic Blood Pressure: 133 mmHg     Is BP treated: No     HDL Cholesterol: 41 mg/dL     Total Cholesterol: 150 mg/dL  ECG 4/03/478/06/20 [id number] Vent rate 73 bpm PR-I 168 ms QRS 88 ms QT/QTc 376/414 ms Normal sinus rhythm  Assessment and Plan: 43 y.o. male with   .Diagnoses and all orders for this visit:  Encounter for  annual physical exam  Lightheadedness -     EKG 12-Lead  History of exertional chest pain -     EKG 12-Lead  Schizoaffective disorder, unspecified type (HCC)  Class 1 obesity with serious comorbidity in adult, unspecified BMI, unspecified obesity type  Hypertension goal BP (blood pressure) < 130/80   - Personally reviewed PMH, PSH, PFH, medications, allergies, HM - Age-appropriate cancer screening: n/a - Tdap UTD - PHQ2 negative - Fall screen negative - Routine fasting labs completed prior to appointment; results reviewed with patient and faxed to psychiatry - BMI 33 - counseled on weight loss through decreased caloric intake and increased aerobic exercise  History of exertional chest pain ECG personally reviewed by me and supervising physician, no ST or T wave abnormality Patient has a history of single episode of right sided chest pain 2 weeks ago He is able to perform >4METS without chest pain  10 yr ASCVD risk 3.2% Encouraged close follow-up if symptoms recur  HTN Counseled patient on therapeutic lifestyle  changes, including avoiding tobacco products Home BP monitoring  Return in 2 weeks for nurse BP check  Patient education and anticipatory guidance given Patient agrees with treatment plan Follow-up in 2 weeks for nurse BP check, 6 months for HTN or sooner as needed  Darlyne Russian PA-C

## 2019-04-11 ENCOUNTER — Encounter: Payer: Self-pay | Admitting: Physician Assistant

## 2019-04-11 DIAGNOSIS — D721 Eosinophilia, unspecified: Secondary | ICD-10-CM | POA: Insufficient documentation

## 2019-04-17 ENCOUNTER — Encounter: Payer: Self-pay | Admitting: Physician Assistant

## 2019-04-17 DIAGNOSIS — F259 Schizoaffective disorder, unspecified: Secondary | ICD-10-CM | POA: Insufficient documentation

## 2019-04-17 DIAGNOSIS — E669 Obesity, unspecified: Secondary | ICD-10-CM | POA: Insufficient documentation

## 2019-04-17 DIAGNOSIS — Z87898 Personal history of other specified conditions: Secondary | ICD-10-CM | POA: Insufficient documentation

## 2019-04-17 DIAGNOSIS — I10 Essential (primary) hypertension: Secondary | ICD-10-CM | POA: Insufficient documentation

## 2019-04-17 DIAGNOSIS — R42 Dizziness and giddiness: Secondary | ICD-10-CM | POA: Insufficient documentation

## 2019-04-22 ENCOUNTER — Ambulatory Visit: Payer: Medicare Other

## 2019-04-28 DIAGNOSIS — F250 Schizoaffective disorder, bipolar type: Secondary | ICD-10-CM | POA: Diagnosis not present

## 2019-05-25 DIAGNOSIS — F250 Schizoaffective disorder, bipolar type: Secondary | ICD-10-CM | POA: Diagnosis not present

## 2019-05-25 DIAGNOSIS — F3112 Bipolar disorder, current episode manic without psychotic features, moderate: Secondary | ICD-10-CM | POA: Diagnosis not present

## 2019-10-18 DIAGNOSIS — F250 Schizoaffective disorder, bipolar type: Secondary | ICD-10-CM | POA: Diagnosis not present

## 2019-10-25 DIAGNOSIS — F250 Schizoaffective disorder, bipolar type: Secondary | ICD-10-CM | POA: Diagnosis not present

## 2019-11-21 DIAGNOSIS — F250 Schizoaffective disorder, bipolar type: Secondary | ICD-10-CM | POA: Diagnosis not present

## 2019-12-19 DIAGNOSIS — F250 Schizoaffective disorder, bipolar type: Secondary | ICD-10-CM | POA: Diagnosis not present

## 2020-01-16 DIAGNOSIS — F250 Schizoaffective disorder, bipolar type: Secondary | ICD-10-CM | POA: Diagnosis not present

## 2020-01-17 DIAGNOSIS — F250 Schizoaffective disorder, bipolar type: Secondary | ICD-10-CM | POA: Diagnosis not present

## 2020-01-17 IMAGING — DX DG CHEST 2V
2 series · 2 of 2 positions shown · non-contrast
Comparison: None.

CLINICAL DATA: Shortness of breath for 1 week

EXAM:
CHEST - 2 VIEW

[chest pa]
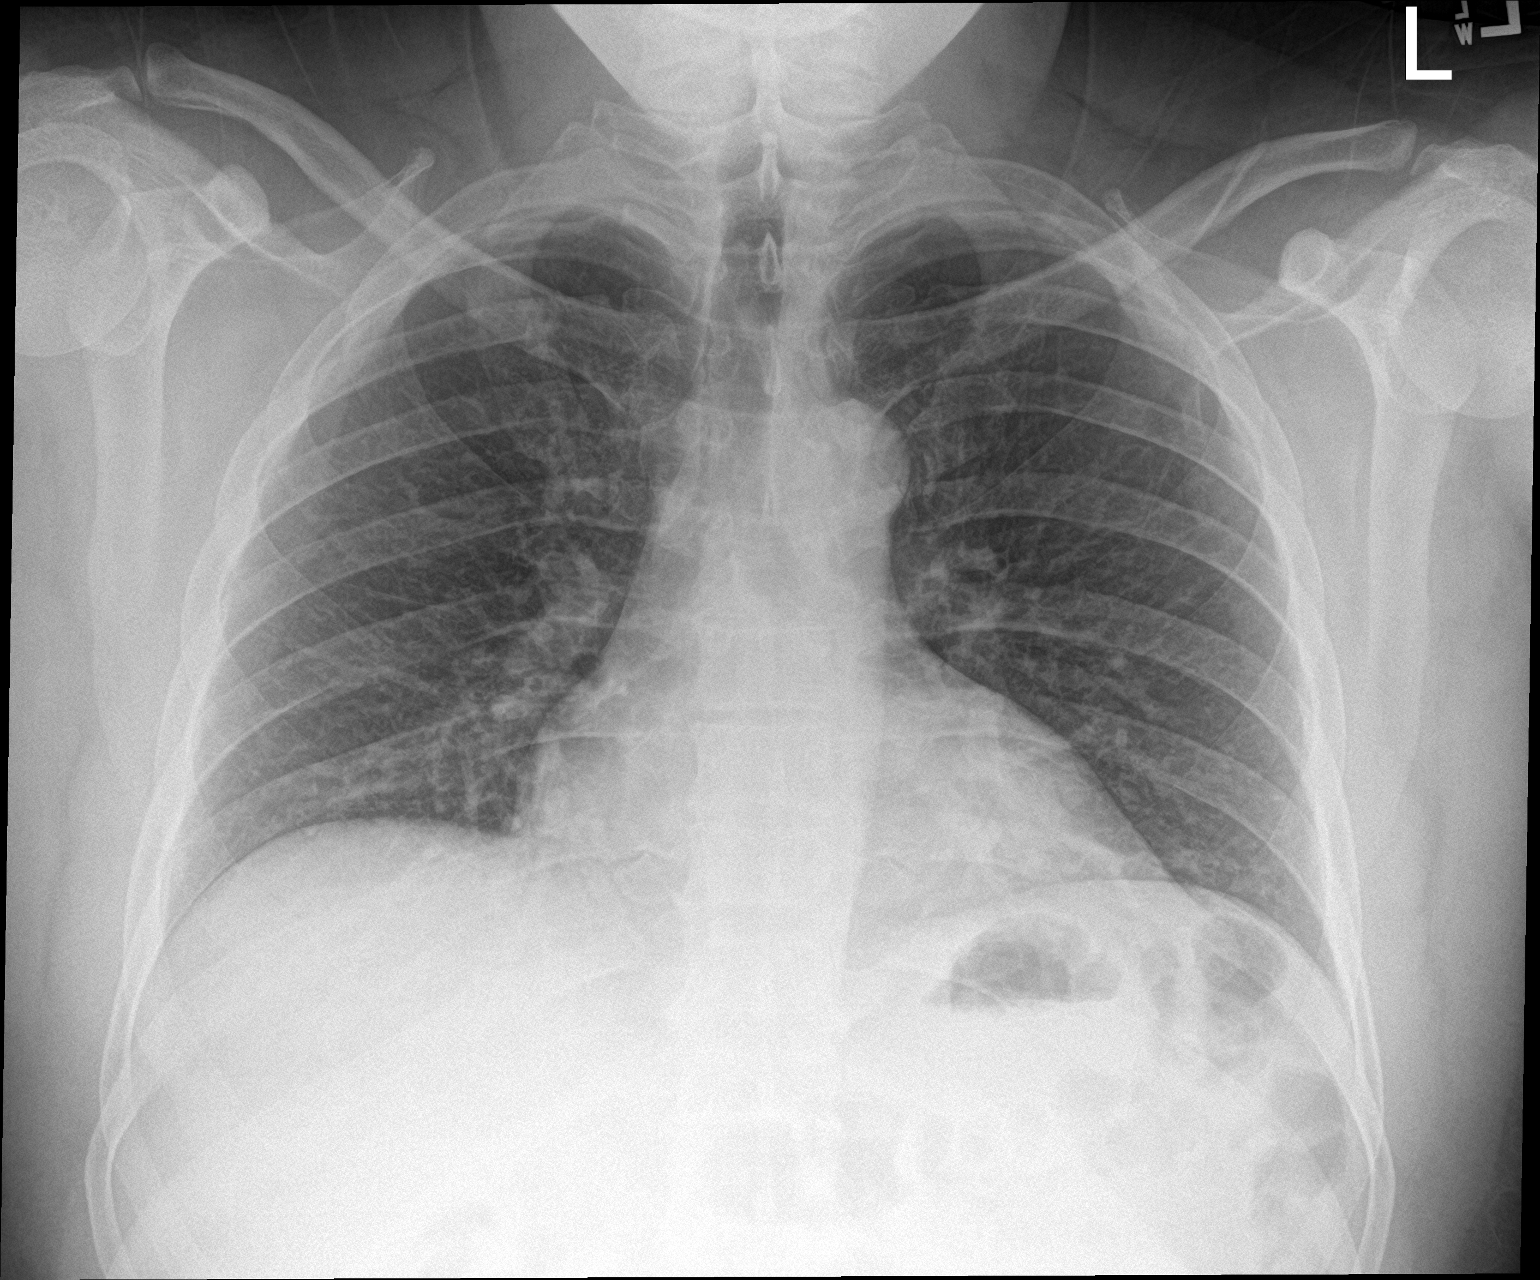

[chest lat]
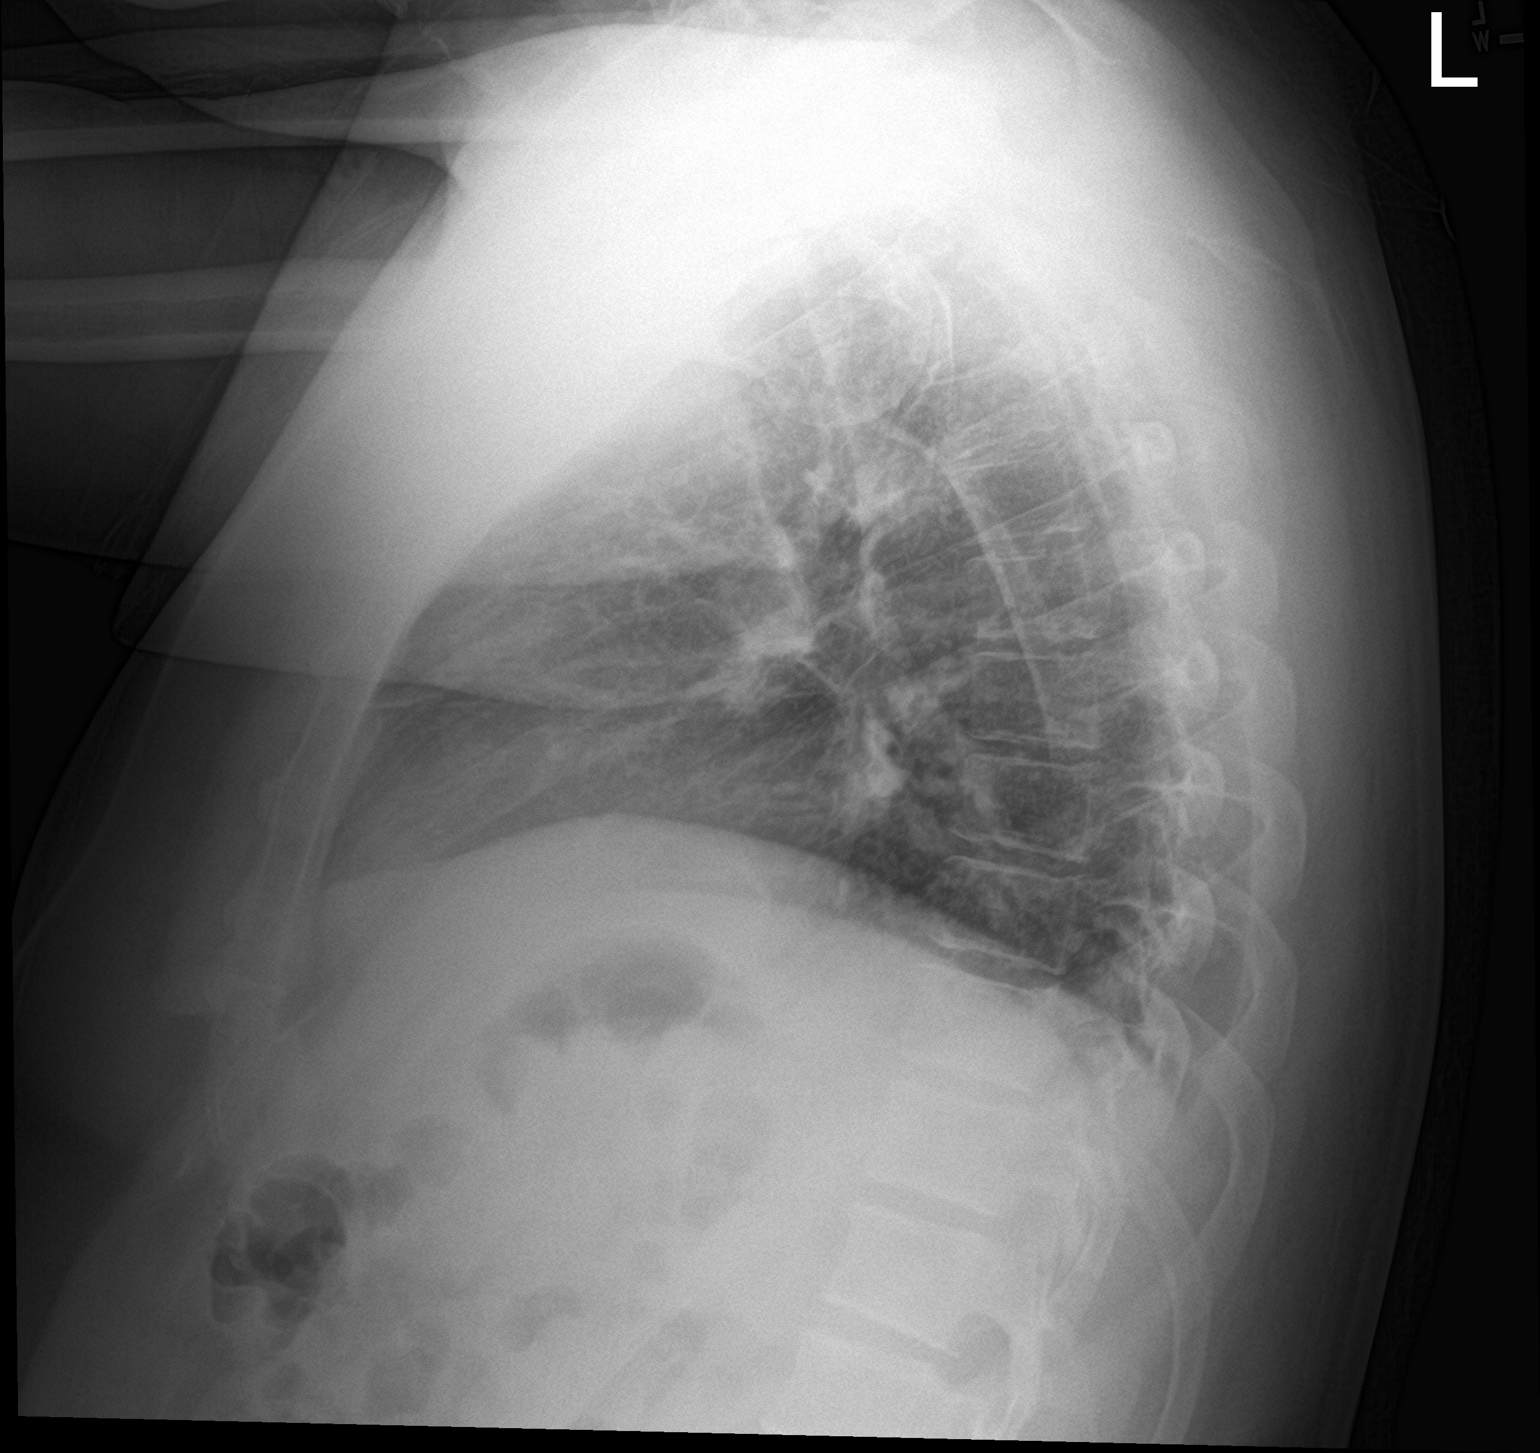

[2 of 2 positions shown; findings below may reference images not displayed]

FINDINGS: The heart size and mediastinal contours are within normal limits.
Both lungs are clear. The visualized skeletal structures are
unremarkable.
IMPRESSION: No active cardiopulmonary disease.

## 2020-02-13 DIAGNOSIS — F250 Schizoaffective disorder, bipolar type: Secondary | ICD-10-CM | POA: Diagnosis not present

## 2020-03-12 DIAGNOSIS — F250 Schizoaffective disorder, bipolar type: Secondary | ICD-10-CM | POA: Diagnosis not present

## 2020-03-13 DIAGNOSIS — Z23 Encounter for immunization: Secondary | ICD-10-CM | POA: Diagnosis not present

## 2020-03-29 DIAGNOSIS — F250 Schizoaffective disorder, bipolar type: Secondary | ICD-10-CM | POA: Diagnosis not present

## 2020-04-09 DIAGNOSIS — F250 Schizoaffective disorder, bipolar type: Secondary | ICD-10-CM | POA: Diagnosis not present

## 2020-05-08 DIAGNOSIS — F250 Schizoaffective disorder, bipolar type: Secondary | ICD-10-CM | POA: Diagnosis not present

## 2020-05-31 DIAGNOSIS — F250 Schizoaffective disorder, bipolar type: Secondary | ICD-10-CM | POA: Diagnosis not present

## 2020-06-05 DIAGNOSIS — F250 Schizoaffective disorder, bipolar type: Secondary | ICD-10-CM | POA: Diagnosis not present

## 2020-06-13 ENCOUNTER — Ambulatory Visit (INDEPENDENT_AMBULATORY_CARE_PROVIDER_SITE_OTHER): Payer: Medicare Other | Admitting: Medical-Surgical

## 2020-06-13 ENCOUNTER — Encounter: Payer: Self-pay | Admitting: Medical-Surgical

## 2020-06-13 VITALS — BP 120/78 | HR 77 | Temp 97.6°F | Ht 65.75 in | Wt 220.4 lb

## 2020-06-13 DIAGNOSIS — Z1159 Encounter for screening for other viral diseases: Secondary | ICD-10-CM | POA: Diagnosis not present

## 2020-06-13 DIAGNOSIS — Z114 Encounter for screening for human immunodeficiency virus [HIV]: Secondary | ICD-10-CM | POA: Diagnosis not present

## 2020-06-13 DIAGNOSIS — R3911 Hesitancy of micturition: Secondary | ICD-10-CM

## 2020-06-13 DIAGNOSIS — H9203 Otalgia, bilateral: Secondary | ICD-10-CM

## 2020-06-13 DIAGNOSIS — Z79899 Other long term (current) drug therapy: Secondary | ICD-10-CM

## 2020-06-13 DIAGNOSIS — Z0000 Encounter for general adult medical examination without abnormal findings: Secondary | ICD-10-CM

## 2020-06-13 DIAGNOSIS — F250 Schizoaffective disorder, bipolar type: Secondary | ICD-10-CM

## 2020-06-13 DIAGNOSIS — Z131 Encounter for screening for diabetes mellitus: Secondary | ICD-10-CM

## 2020-06-13 DIAGNOSIS — R300 Dysuria: Secondary | ICD-10-CM

## 2020-06-13 DIAGNOSIS — F1010 Alcohol abuse, uncomplicated: Secondary | ICD-10-CM

## 2020-06-13 DIAGNOSIS — Z7689 Persons encountering health services in other specified circumstances: Secondary | ICD-10-CM | POA: Diagnosis not present

## 2020-06-13 DIAGNOSIS — Z23 Encounter for immunization: Secondary | ICD-10-CM

## 2020-06-13 LAB — POCT URINALYSIS DIPSTICK
Bilirubin, UA: NEGATIVE
Blood, UA: NEGATIVE
Glucose, UA: NEGATIVE
Ketones, UA: NEGATIVE
Leukocytes, UA: NEGATIVE
Nitrite, UA: NEGATIVE
Protein, UA: NEGATIVE
Spec Grav, UA: 1.02 (ref 1.010–1.025)
Urobilinogen, UA: 0.2 E.U./dL
pH, UA: 6.5 (ref 5.0–8.0)

## 2020-06-13 MED ORDER — NEOMYCIN-POLYMYXIN-HC 1 % OT SOLN: 3.0000 [drp] | Freq: Three times a day (TID) | OTIC | 0 refills | Status: DC

## 2020-06-13 NOTE — Patient Instructions (Signed)

## 2020-06-13 NOTE — Progress Notes (Signed)
HPI: Albert Gould is a 44 y.o. male who  has a past medical history of Anxiety, Depression, and Mood disorder (HCC).  he presents to [facility] today, 06/13/20,  for chief complaint of: Annual physical exam  Dentist: none recent Eye exam: none recent, no correction Exercise: walks sometimes Diet: well-balanced, eats all food groups COVID vaccine: Reports J&J vaccine 2-3 months ago  Concerns: Needs labs  Past medical, surgical, social and family history reviewed:  Patient Active Problem List   Diagnosis Date Noted  . Lightheadedness 04/17/2019  . History of exertional chest pain 04/17/2019  . Schizoaffective disorder (HCC) 04/17/2019  . Class 1 obesity with serious comorbidity in adult 04/17/2019  . Hypertension goal BP (blood pressure) < 130/80 04/17/2019  . Eosinophilia 04/11/2019  . Shortness of breath 06/21/2018  . Anxiety state 06/21/2018  . Acute otalgia, right 06/21/2018  . Encounter for medication monitoring 12/10/2017  . Chronic midline low back pain without sciatica 12/10/2017  . Mood disorder [facility])     Past Surgical History:  Procedure Laterality Date  . APPENDECTOMY      Social History   Tobacco Use  . Smoking status: Current Some Day Smoker    Types: Cigarettes  . Smokeless tobacco: Never Used  . Tobacco comment: 1 cig/week  Substance Use Topics  . Alcohol use: Never    Family History  Problem Relation Age of Onset  . Healthy Mother   . Healthy Father      Current medication list and allergy/intolerance information reviewed:    Current Outpatient Medications  Medication Sig Dispense Refill  . divalproex (DEPAKOTE ER) 500 MG 24 hr tablet Take 500 mg by mouth 2 (two) times daily.     . benztropine (COGENTIN) 1 MG tablet  (Patient not taking: Reported on 06/13/2020)    . INVEGA SUSTENNA 234 MG/1.5ML SUSY injection  (Patient not taking: Reported on 06/13/2020)    . NEOMYCIN-POLYMYXIN-HYDROCORTISONE  (CORTISPORIN) 1 % SOLN OTIC solution Place 3 drops into both ears 3 (three) times daily. 10 mL 0   No current facility-administered medications for this visit.    No Known Allergies    Review of Systems:  Constitutional:  No  fever, no chills, + recent illness about 2 weeks, No unintentional weight changes. No significant fatigue.   HEENT: No  headache, no vision change, + hearing change, No sore throat, No  sinus pressure, + pain/itching bilateral ears  Cardiac: No  chest pain, No  pressure, No palpitations, No  Orthopnea  Respiratory:  No  shortness of breath. No  Cough  Gastrointestinal: No  abdominal pain, No  nausea, No  vomiting,  No  blood in stool, No  diarrhea, No  constipation   Musculoskeletal: No new myalgia/arthralgia  Skin: No  Rash, No other wounds/concerning lesions  Genitourinary: No  incontinence, No  abnormal genital bleeding, No abnormal genital discharge  Hem/Onc: No  easy bruising/bleeding, No  abnormal lymph node  Endocrine: No cold intolerance,  No heat intolerance. No polyuria/polydipsia/polyphagia   Neurologic: No  weakness, No  dizziness, No  slurred speech/focal weakness/facial droop  Psychiatric: No  concerns with depression, +  concerns with anxiety, No sleep problems, No mood problems  Exam:  BP 120/78   Pulse 77   Temp 97.6 F (36.4 C) (Oral)   Ht 5' 5.75" (1.67 m)   Wt 220 lb 6.4 oz (100 kg)   SpO2 94%   BMI 35.84 kg/m   Constitutional: VS see above.  General Appearance: alert, well-developed, well-nourished, NAD. Anxious, agitated but cooperative, pleasant.  Eyes: Normal lids and conjunctive, non-icteric sclera  Ears, Nose, Mouth, Throat: MMM, Normal external inspection ears/nares/mouth/lips/gums. TM normal bilaterally. Erythema to bilateral ear canals, no exudate. Pharynx/tonsils no erythema, no exudate. Nasal mucosa normal.   Neck: No masses, trachea midline. No thyroid enlargement. No tenderness/mass appreciated. No  lymphadenopathy  Respiratory: Normal respiratory effort. no wheeze, no rhonchi, no rales  Cardiovascular: S1/S2 normal, no murmur, no rub/gallop auscultated. RRR. No lower extremity edema. Pedal pulse II/IV bilaterally DP and PT. No carotid bruit or JVD. No abdominal aortic bruit.  Gastrointestinal: Nontender, no masses. No hepatomegaly, no splenomegaly. No hernia appreciated. Bowel sounds normal. Rectal exam deferred.   Musculoskeletal: Gait normal. No clubbing/cyanosis of digits.   Neurological: Normal balance/coordination. No tremor. No cranial nerve deficit on limited exam. Motor and sensation intact and symmetric. Cerebellar reflexes intact.   Skin: warm, dry, intact. 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 No concerning nevi or subq nodules on limited exam.    Psychiatric: Normal judgment/insight. Normal mood and affect. Oriented x3.    Results for orders placed or performed in visit on 06/13/20 (from the past 72 hour(s))  POCT Urinalysis Dipstick     Status: Normal   Collection Time: 06/13/20  9:03 AM  Result Value Ref Range   Color, UA yellow    Clarity, UA clear    Glucose, UA Negative Negative   Bilirubin, UA n    Ketones, UA n    Spec Grav, UA 1.020 1.010 - 1.025   Blood, UA n    pH, UA 6.5 5.0 - 8.0   Protein, UA Negative Negative   Urobilinogen, UA 0.2 0.2 or 1.0 E.U./dL   Nitrite, UA n    Leukocytes, UA Negative Negative   Appearance yellow    Odor none     No results found.   ASSESSMENT/PLAN:   1. Encounter for annual physical exam Checking CBC with differential, CMP, and lipid panel today.  Due for dental care and eye exam but currently without insurance to cover and limited by finances.  [facility]  2. Encounter to establish care Reviewed available information and discussed health care concerns with patient.  His brother helps to take care of him.  [facility]  conditions.  3. Need for hepatitis C screening test Discussed screening recommendations.  We will add this to blood work today. - Hepatitis C antibody  4. Screening for HIV (human immunodeficiency virus) Discussed screening recommendations.  We will add this to blood work today as well. - HIV Antibody (routine testing w rflx)  5. Schizoaffective disorder, bipolar type (HCC) [facility].  Checking valproic acid level per the request. - Valproic Acid level  6. Alcohol abuse Checking valproic acid level.  Encouraged avoidance of alcohol while on this medication. - Valproic Acid level  7. On valproate therapy Checking valproic acid level.  Also checking hemoglobin A1c and CBC with differential. - Hemoglobin A1c  8. Screening for diabetes mellitus Checking hemoglobin A1c. - Hemoglobin A1c  9.  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  Sending in Cortisporin 3 times daily to see if this will reduce the pain/itching he is experiencing.   Orders Placed This Encounter  Procedures  . Flu Vaccine QUAD 36+ mos IM  . Hepatitis C antibody  . HIV Antibody (routine testing w rflx)  . COMPLETE METABOLIC PANEL WITH GFR  . Hemoglobin A1c  . Lipid panel  . CBC with Differential/Platelet  . Valproic Acid level  . PSA  . POCT Urinalysis Dipstick    Meds ordered this encounter  Medications  . NEOMYCIN-POLYMYXIN-HYDROCORTISONE (CORTISPORIN) 1 % SOLN OTIC solution    Sig: Place 3 drops into both ears 3 (three) times daily.    Dispense:  10 mL    Refill:  0    Order Specific Question:   Supervising Provider    Answer:   Sunnie Nielsen [[id number]]    Patient Instructions  Influenza (Flu) Vaccine (Inactivated or Recombinant): What You Need to Know 1. Why get vaccinated? Influenza vaccine can prevent influenza  (flu). Flu is a contagious disease that spreads around the [place] every year, usually between October and May. Anyone can get the flu, but it is more dangerous for some people. Infants and young children, people 74 years of age and older, pregnant women, and people with certain health conditions or a weakened immune system are at greatest risk of flu complications. Pneumonia, bronchitis, sinus infections and ear infections are examples of flu-related complications. If you have a medical condition, such as heart disease, cancer or diabetes, flu can make it worse. Flu can cause fever and chills, sore throat, muscle aches, fatigue, cough, headache, and runny or stuffy nose. Some people may have vomiting and diarrhea, though this is more common in children than adults. Each year thousands of people in the [place] die from flu, and many more are hospitalized. Flu vaccine prevents millions of illnesses and flu-related visits to the doctor each year. 2. Influenza vaccine CDC recommends everyone 47 months of age and older get vaccinated every flu season. Children 6 months through 24 years of age may need 2 doses during a single flu season. Everyone else needs only 1 dose each flu season. It takes about 2 weeks for protection to develop after vaccination. There are many flu viruses, and they are always changing. Each year a new flu vaccine is made to protect against three or four viruses that are likely to cause disease in the upcoming flu season. Even when the vaccine doesn't exactly match these viruses, it may still provide some protection. Influenza vaccine does not cause flu. Influenza vaccine may be given at the same time as other vaccines. 3. Talk with your health care provider Tell your vaccine provider if the person getting the vaccine:  Has had an allergic reaction after a previous dose of influenza vaccine, or has any severe, life-threatening allergies.  Has ever had Guillain-Barr  Syndrome (also called GBS). In some cases, your health care provider may decide to postpone influenza vaccination to a future visit. People with minor illnesses, such as a cold, may be vaccinated. People who are moderately or severely ill should usually wait until they recover before getting influenza vaccine. Your health care provider can give you more information. 4. Risks of a vaccine reaction  Soreness, redness, and swelling where shot is given, fever, muscle aches, and headache can happen after influenza vaccine.  There may be a very small increased risk of Guillain-Barr Syndrome (GBS) after inactivated influenza vaccine (the flu shot). Young children who get the flu shot along with pneumococcal vaccine (PCV13), and/or DTaP vaccine at the same time might be slightly more likely to have a seizure  caused by fever. Tell your health care provider if a child who is getting flu vaccine has ever had a seizure. People sometimes faint after medical procedures, including vaccination. Tell your provider if you feel dizzy or have vision changes or ringing in the ears. As with any medicine, there is a very remote chance of a vaccine causing a severe allergic reaction, other serious injury, or death. 5. What if there is a serious problem? An allergic reaction could occur after the vaccinated person leaves the clinic. If you see signs of a severe allergic reaction (hives, swelling of the face and throat, difficulty breathing, a fast heartbeat, dizziness, or weakness), call 9-1-1 and get the person to the nearest hospital. For other signs that concern you, call your health care provider. Adverse reactions should be reported to the Vaccine Adverse Event Reporting System (VAERS). Your health care provider will usually file this report, or you can do it yourself. Visit the VAERS website at www.vaers.LAgents.no or call 859-277-2483.VAERS is only for reporting reactions, and VAERS staff do not give medical  advice. 6. The National Vaccine Injury Compensation Program The [employer] Vaccine Injury Compensation Program (VICP) is a federal program that was created to compensate people who may have been injured by certain vaccines. Visit the VICP website at SpiritualWord.at or call (867)070-8655 to learn about the program and about filing a claim. There is a time limit to file a claim for compensation. 7. How can I learn more?  Ask your healthcare provider.  Call your local or state health department.  Contact the Centers for Disease Control and Prevention (CDC): ? Call 629-849-2831 (1-800-CDC-INFO) or ? Visit CDC's BiotechRoom.com.cy Vaccine Information Statement (Interim) Inactivated Influenza Vaccine (04/15/2018) This information is not intended to replace advice given to you by your health care provider. Make sure you discuss any questions you have with your health care provider. Document Revised: 12/07/2018 Document Reviewed: 04/19/2018 Elsevier Patient Education  2020 [employer].   Follow-up plan: Return in about 1 year (around 06/13/2021) for annual physical exam.  Thayer Ohm, DNP, APRN, FNP-BC [facility]

## 2020-06-14 LAB — VALPROIC ACID LEVEL: Valproic Acid Lvl: 45.2 mg/L — ABNORMAL LOW (ref 50.0–100.0)

## 2020-06-14 LAB — COMPLETE METABOLIC PANEL WITH GFR
AG Ratio: 1.7 (calc) (ref 1.0–2.5)
ALT: 28 U/L (ref 9–46)
AST: 21 U/L (ref 10–40)
Albumin: 4.4 g/dL (ref 3.6–5.1)
Alkaline phosphatase (APISO): 60 U/L (ref 36–130)
BUN: 17 mg/dL (ref 7–25)
CO2: 23 mmol/L (ref 20–32)
Calcium: 9.2 mg/dL (ref 8.6–10.3)
Chloride: 106 mmol/L (ref 98–110)
Creat: 0.89 mg/dL (ref 0.60–1.35)
GFR, Est African American: 121 mL/min/{1.73_m2} (ref >=60)
GFR, Est Non African American: 105 mL/min/{1.73_m2} (ref >=60)
Globulin: 2.6 g/dL (calc) (ref 1.9–3.7)
Glucose, Bld: 101 mg/dL — ABNORMAL HIGH (ref 65–99)
Potassium: 4.2 mmol/L (ref 3.5–5.3)
Sodium: 139 mmol/L (ref 135–146)
Total Bilirubin: 0.4 mg/dL (ref 0.2–1.2)
Total Protein: 7 g/dL (ref 6.1–8.1)

## 2020-06-14 LAB — CBC WITH DIFFERENTIAL/PLATELET
Absolute Monocytes: 613 cells/uL (ref 200–950)
Basophils Absolute: 37 cells/uL (ref 0–200)
Basophils Relative: 0.5 %
Eosinophils Absolute: 438 cells/uL (ref 15–500)
Eosinophils Relative: 6 %
HCT: 48.1 % (ref 38.5–50.0)
Hemoglobin: 16.2 g/dL (ref 13.2–17.1)
Lymphs Abs: 2227 cells/uL (ref 850–3900)
MCH: 27.6 pg (ref 27.0–33.0)
MCHC: 33.7 g/dL (ref 32.0–36.0)
MCV: 82.1 fL (ref 80.0–100.0)
MPV: 10.4 fL (ref 7.5–12.5)
Monocytes Relative: 8.4 %
Neutro Abs: 3986 cells/uL (ref 1500–7800)
Neutrophils Relative %: 54.6 %
Platelets: 223 10*3/uL (ref 140–400)
RBC: 5.86 10*6/uL — ABNORMAL HIGH (ref 4.20–5.80)
RDW: 13.8 % (ref 11.0–15.0)
Total Lymphocyte: 30.5 %
WBC: 7.3 10*3/uL (ref 3.8–10.8)

## 2020-06-14 LAB — HEPATITIS C ANTIBODY
Hepatitis C Ab: NONREACTIVE
SIGNAL TO CUT-OFF: 0.03 (ref <1.00)

## 2020-06-14 LAB — LIPID PANEL
Cholesterol: 180 mg/dL
HDL: 47 mg/dL
LDL Cholesterol (Calc): 110 mg/dL — ABNORMAL HIGH
Non-HDL Cholesterol (Calc): 133 mg/dL — ABNORMAL HIGH
Total CHOL/HDL Ratio: 3.8 (calc)
Triglycerides: 120 mg/dL

## 2020-06-14 LAB — HIV ANTIBODY (ROUTINE TESTING W REFLEX): HIV 1&2 Ab, 4th Generation: NONREACTIVE

## 2020-06-14 LAB — HEMOGLOBIN A1C
Hgb A1c MFr Bld: 5.6 %{Hb}
Mean Plasma Glucose: 114 (calc)
eAG (mmol/L): 6.3 (calc)

## 2020-06-14 LAB — PSA: PSA: 0.36 ng/mL (ref <=4.0)

## 2020-07-04 DIAGNOSIS — F250 Schizoaffective disorder, bipolar type: Secondary | ICD-10-CM | POA: Diagnosis not present

## 2020-09-19 DIAGNOSIS — Z23 Encounter for immunization: Secondary | ICD-10-CM | POA: Diagnosis not present

## 2020-09-26 DIAGNOSIS — F250 Schizoaffective disorder, bipolar type: Secondary | ICD-10-CM | POA: Diagnosis not present

## 2020-10-11 DIAGNOSIS — F250 Schizoaffective disorder, bipolar type: Secondary | ICD-10-CM | POA: Diagnosis not present

## 2020-12-24 DIAGNOSIS — F250 Schizoaffective disorder, bipolar type: Secondary | ICD-10-CM | POA: Diagnosis not present

## 2021-03-20 DIAGNOSIS — F250 Schizoaffective disorder, bipolar type: Secondary | ICD-10-CM | POA: Diagnosis not present

## 2021-04-25 DIAGNOSIS — F250 Schizoaffective disorder, bipolar type: Secondary | ICD-10-CM | POA: Diagnosis not present

## 2021-06-12 ENCOUNTER — Ambulatory Visit: Payer: Medicare Other | Admitting: Family Medicine

## 2021-06-12 DIAGNOSIS — F250 Schizoaffective disorder, bipolar type: Secondary | ICD-10-CM | POA: Diagnosis not present

## 2021-07-12 ENCOUNTER — Other Ambulatory Visit: Payer: Self-pay | Admitting: Medical-Surgical

## 2021-07-12 DIAGNOSIS — Z1329 Encounter for screening for other suspected endocrine disorder: Secondary | ICD-10-CM

## 2021-07-12 DIAGNOSIS — E669 Obesity, unspecified: Secondary | ICD-10-CM

## 2021-07-12 DIAGNOSIS — Z0000 Encounter for general adult medical examination without abnormal findings: Secondary | ICD-10-CM

## 2021-07-12 DIAGNOSIS — Z131 Encounter for screening for diabetes mellitus: Secondary | ICD-10-CM

## 2021-07-12 DIAGNOSIS — I10 Essential (primary) hypertension: Secondary | ICD-10-CM

## 2021-07-15 DIAGNOSIS — Z1329 Encounter for screening for other suspected endocrine disorder: Secondary | ICD-10-CM | POA: Diagnosis not present

## 2021-07-15 DIAGNOSIS — Z0000 Encounter for general adult medical examination without abnormal findings: Secondary | ICD-10-CM | POA: Diagnosis not present

## 2021-07-15 DIAGNOSIS — I10 Essential (primary) hypertension: Secondary | ICD-10-CM | POA: Diagnosis not present

## 2021-07-16 LAB — COMPLETE METABOLIC PANEL WITH GFR
AG Ratio: 1.5 (calc) (ref 1.0–2.5)
ALT: 30 U/L (ref 9–46)
AST: 21 U/L (ref 10–40)
Albumin: 4 g/dL (ref 3.6–5.1)
Alkaline phosphatase (APISO): 75 U/L (ref 36–130)
BUN: 15 mg/dL (ref 7–25)
CO2: 24 mmol/L (ref 20–32)
Calcium: 8.7 mg/dL (ref 8.6–10.3)
Chloride: 107 mmol/L (ref 98–110)
Creat: 0.86 mg/dL (ref 0.60–1.29)
Globulin: 2.6 g/dL (calc) (ref 1.9–3.7)
Glucose, Bld: 93 mg/dL (ref 65–99)
Potassium: 4 mmol/L (ref 3.5–5.3)
Sodium: 141 mmol/L (ref 135–146)
Total Bilirubin: 0.3 mg/dL (ref 0.2–1.2)
Total Protein: 6.6 g/dL (ref 6.1–8.1)
eGFR: 109 mL/min/{1.73_m2} (ref >=60)

## 2021-07-16 LAB — LIPID PANEL
Cholesterol: 154 mg/dL (ref <200)
HDL: 41 mg/dL (ref >=40)
LDL Cholesterol (Calc): 94 mg/dL (calc)
Non-HDL Cholesterol (Calc): 113 mg/dL (calc) (ref <130)
Total CHOL/HDL Ratio: 3.8 (calc) (ref <5.0)
Triglycerides: 91 mg/dL (ref <150)

## 2021-07-16 LAB — CBC WITH DIFFERENTIAL/PLATELET
Absolute Monocytes: 537 cells/uL (ref 200–950)
Basophils Absolute: 40 cells/uL (ref 0–200)
Basophils Relative: 0.5 %
Eosinophils Absolute: 387 cells/uL (ref 15–500)
Eosinophils Relative: 4.9 %
HCT: 47.2 % (ref 38.5–50.0)
Hemoglobin: 16 g/dL (ref 13.2–17.1)
Lymphs Abs: 3358 cells/uL (ref 850–3900)
MCH: 27.6 pg (ref 27.0–33.0)
MCHC: 33.9 g/dL (ref 32.0–36.0)
MCV: 81.4 fL (ref 80.0–100.0)
MPV: 9.8 fL (ref 7.5–12.5)
Monocytes Relative: 6.8 %
Neutro Abs: 3579 cells/uL (ref 1500–7800)
Neutrophils Relative %: 45.3 %
Platelets: 231 10*3/uL (ref 140–400)
RBC: 5.8 10*6/uL (ref 4.20–5.80)
RDW: 13.7 % (ref 11.0–15.0)
Total Lymphocyte: 42.5 %
WBC: 7.9 10*3/uL (ref 3.8–10.8)

## 2021-07-16 LAB — TSH: TSH: 3.94 mIU/L (ref 0.40–4.50)

## 2021-08-22 ENCOUNTER — Ambulatory Visit: Payer: Medicare Other | Admitting: Medical-Surgical

## 2021-09-11 DIAGNOSIS — F250 Schizoaffective disorder, bipolar type: Secondary | ICD-10-CM | POA: Diagnosis not present

## 2021-09-13 ENCOUNTER — Ambulatory Visit: Payer: Medicare Other

## 2021-09-17 ENCOUNTER — Ambulatory Visit (INDEPENDENT_AMBULATORY_CARE_PROVIDER_SITE_OTHER): Payer: Self-pay | Admitting: Medical-Surgical

## 2021-09-17 DIAGNOSIS — F250 Schizoaffective disorder, bipolar type: Secondary | ICD-10-CM | POA: Diagnosis not present

## 2021-09-17 DIAGNOSIS — Z91199 Patient's noncompliance with other medical treatment and regimen due to unspecified reason: Secondary | ICD-10-CM

## 2021-09-17 NOTE — Progress Notes (Signed)
   Complete physical exam  Patient: Albert Gould   DOB: 06/21/1998   46 y.o. Male  MRN: [id number]  Subjective:    No chief complaint on file.   Albert Gould is a 46 y.o. male who presents today for a complete physical exam. She reports consuming a {diet types:17450} diet. {types:19826} She generally feels {DESC; WELL/FAIRLY WELL/POORLY:18703}. She reports sleeping {DESC; WELL/FAIRLY WELL/POORLY:18703}. She {does/does not:200015} have additional problems to discuss today.    Most recent fall risk assessment:    02/26/2022   10:42 AM  Fall Risk   Falls in the past year? 0  Number falls in past yr: 0  Injury with Fall? 0  Risk for fall due to : No Fall Risks  Follow up Falls evaluation completed     Most recent depression screenings:    02/26/2022   10:42 AM 01/17/2021   10:46 AM  PHQ 2/9 Scores  PHQ - 2 Score 0 0  PHQ- 9 Score 5     {VISON DENTAL STD PSA (Optional):27386}  {History (Optional):23778}  Patient Care Team: Lexxi Koslow, NP as PCP - General (Nurse Practitioner)   Outpatient Medications Prior to Visit  Medication Sig   fluticasone (FLONASE) 50 MCG/ACT nasal spray Place 2 sprays into both nostrils in the morning and at bedtime. After 7 days, reduce to once daily.   norgestimate-ethinyl estradiol (SPRINTEC 28) 0.25-35 MG-MCG tablet Take 1 tablet by mouth daily.   Nystatin POWD Apply liberally to affected area 2 times per day   spironolactone (ALDACTONE) 100 MG tablet Take 1 tablet (100 mg total) by mouth daily.   No facility-administered medications prior to visit.    ROS        Objective:     There were no vitals taken for this visit. {Vitals History (Optional):23777}  Physical Exam   No results found for any visits on 04/03/22. {Show previous labs (optional):23779}    Assessment & Plan:    Routine Health Maintenance and Physical Exam  Immunization History  Administered Date(s) Administered   DTaP 09/04/1999, 10/31/1999,  01/09/2000, 09/24/2000, 04/09/2004   Hepatitis A 02/04/2008, 02/09/2009   Hepatitis B 06/22/1999, 07/30/1999, 01/09/2000   HiB (PRP-OMP) 09/04/1999, 10/31/1999, 01/09/2000, 09/24/2000   IPV 09/04/1999, 10/31/1999, 06/29/2000, 04/09/2004   Influenza,inj,Quad PF,6+ Mos 05/12/2014   Influenza-Unspecified 08/11/2012   MMR 06/29/2001, 04/09/2004   Meningococcal Polysaccharide 02/09/2012   Pneumococcal Conjugate-13 09/24/2000   Pneumococcal-Unspecified 01/09/2000, 03/24/2000   Tdap 02/09/2012   Varicella 06/29/2000, 02/04/2008    Health Maintenance  Topic Date Due   HIV Screening  Never done   Hepatitis C Screening  Never done   INFLUENZA VACCINE  04/01/2022   PAP-Cervical Cytology Screening  04/03/2022 (Originally 06/20/2020)   PAP SMEAR-Modifier  04/03/2022 (Originally 06/20/2020)   TETANUS/TDAP  04/03/2022 (Originally 02/08/2022)   HPV VACCINES  Discontinued   COVID-19 Vaccine  Discontinued    Discussed health benefits of physical activity, and encouraged her to engage in regular exercise appropriate for her age and condition.  Problem List Items Addressed This Visit   None Visit Diagnoses     Annual physical exam    -  Primary   Cervical cancer screening       Need for Tdap vaccination          No follow-ups on file.     Derrica Sieg, NP   

## 2021-09-23 ENCOUNTER — Ambulatory Visit (INDEPENDENT_AMBULATORY_CARE_PROVIDER_SITE_OTHER): Payer: Medicare Other | Admitting: Medical-Surgical

## 2021-09-23 DIAGNOSIS — Z0000 Encounter for general adult medical examination without abnormal findings: Secondary | ICD-10-CM | POA: Diagnosis not present

## 2021-09-23 NOTE — Patient Instructions (Addendum)
MEDICARE ANNUAL WELLNESS VISIT Health Maintenance Summary and Written Plan of Care  Albert Gould ,  Thank you for allowing me to perform your Medicare Annual Wellness Visit and for your ongoing commitment to your health.   Health Maintenance & Immunization History Health Maintenance  Topic Date Due   COVID-19 Vaccine (1) 10/09/2021 (Originally 11/12/1976)   INFLUENZA VACCINE  11/29/2021 (Originally 04/01/2021)   TETANUS/TDAP  12/11/2027   Hepatitis C Screening  Completed   HIV Screening  Completed   HPV VACCINES  Aged Out   Immunization History  Administered Date(s) Administered   Influenza,inj,Quad PF,6+ Mos 06/13/2020   Tdap 12/10/2017    These are the patient goals that we discussed:  Goals Addressed               This Visit's Progress     Patient Stated (pt-stated)        Would like to be able to get more self-control.         This is a list of Health Maintenance Items that are overdue or due now: Influenza vaccine  Orders/Referrals Placed Today: No orders of the defined types were placed in this encounter.  (Contact our referral department at [phone] if you have not spoken with someone about your referral appointment within the next 5 days)    Follow-up Plan Follow-up with Christen Butter, NP as planned Flu shot can be done at your next in-office visit or at the pharmacy. Medicare wellness visit in one year.  AVS printed and mailed to the patient.      Health Maintenance, Male Adopting a healthy lifestyle and getting preventive care are important in promoting health and wellness. Ask your health care provider about: The right schedule for you to have regular tests and exams. Things you can do on your own to prevent diseases and keep yourself healthy. What should I know about diet, weight, and exercise? Eat a healthy diet  Eat a diet that includes plenty of vegetables, fruits, low-fat dairy products, and lean protein. Do not eat a lot of foods that  are high in solid fats, added sugars, or sodium. Maintain a healthy weight Body mass index (BMI) is a measurement that can be used to identify possible weight problems. It estimates body fat based on height and weight. Your health care provider can help determine your BMI and help you achieve or maintain a healthy weight. Get regular exercise Get regular exercise. This is one of the most important things you can do for your health. Most adults should: Exercise for at least 150 minutes each week. The exercise should increase your heart rate and make you sweat (moderate-intensity exercise). Do strengthening exercises at least twice a week. This is in addition to the moderate-intensity exercise. Spend less time sitting. Even light physical activity can be beneficial. Watch cholesterol and blood lipids Have your blood tested for lipids and cholesterol at 45 years of age, then have this test every 5 years. You may need to have your cholesterol levels checked more often if: Your lipid or cholesterol levels are high. You are older than 45 years of age. You are at high risk for heart disease. What should I know about cancer screening? Many types of cancers can be detected early and may often be prevented. Depending on your health history and family history, you may need to have cancer screening at various ages. This may include screening for: Colorectal cancer. Prostate cancer. Skin cancer. Lung cancer. What should I know about  heart disease, diabetes, and high blood pressure? Blood pressure and heart disease High blood pressure causes heart disease and increases the risk of stroke. This is more likely to develop in people who have high blood pressure readings or are overweight. Talk with your health care provider about your target blood pressure readings. Have your blood pressure checked: Every 3-5 years if you are 41-48 years of age. Every year if you are 86 years old or older. If you are  between the ages of 54 and 18 and are a current or former smoker, ask your health care provider if you should have a one-time screening for abdominal aortic aneurysm (AAA). Diabetes Have regular diabetes screenings. This checks your fasting blood sugar level. Have the screening done: Once every three years after age 41 if you are at a normal weight and have a low risk for diabetes. More often and at a younger age if you are overweight or have a high risk for diabetes. What should I know about preventing infection? Hepatitis B If you have a higher risk for hepatitis B, you should be screened for this virus. Talk with your health care provider to find out if you are at risk for hepatitis B infection. Hepatitis C Blood testing is recommended for: Everyone born from [birth year] through 1964. Anyone with known risk factors for hepatitis C. Sexually transmitted infections (STIs) You should be screened each year for STIs, including gonorrhea and chlamydia, if: You are sexually active and are younger than 45 years of age. You are older than 45 years of age and your health care provider tells you that you are at risk for this type of infection. Your sexual activity has changed since you were last screened, and you are at increased risk for chlamydia or gonorrhea. Ask your health care provider if you are at risk. Ask your health care provider about whether you are at high risk for HIV. Your health care provider may recommend a prescription medicine to help prevent HIV infection. If you choose to take medicine to prevent HIV, you should first get tested for HIV. You should then be tested every 3 months for as long as you are taking the medicine. Follow these instructions at home: Alcohol use Do not drink alcohol if your health care provider tells you not to drink. If you drink alcohol: Limit how much you have to 0-2 drinks a day. Know how much alcohol is in your drink. In the [place] one drink equals one 12 oz  bottle of beer (355 mL), one 5 oz glass of wine (148 mL), or one 1 oz glass of hard liquor (44 mL). Lifestyle Do not use any products that contain nicotine or tobacco. These products include cigarettes, chewing tobacco, and vaping devices, such as e-cigarettes. If you need help quitting, ask your health care provider. Do not use street drugs. Do not share needles. Ask your health care provider for help if you need support or information about quitting drugs. General instructions Schedule regular health, dental, and eye exams. Stay current with your vaccines. Tell your health care provider if: You often feel depressed. You have ever been abused or do not feel safe at home. Summary Adopting a healthy lifestyle and getting preventive care are important in promoting health and wellness. Follow your health care provider's instructions about healthy diet, exercising, and getting tested or screened for diseases. Follow your health care provider's instructions on monitoring your cholesterol and blood pressure. This information is not intended to replace  advice given to you by your health care provider. Make sure you discuss any questions you have with your health care provider. Document Revised: 01/07/2021 Document Reviewed: 01/07/2021 Elsevier Patient Education  [place].

## 2021-09-23 NOTE — Progress Notes (Signed)
MEDICARE ANNUAL WELLNESS VISIT  09/23/2021  Telephone Visit Disclaimer This Medicare AWV was conducted by telephone due to national recommendations for restrictions regarding the COVID-19 Pandemic (e.g. social distancing).  Albert Gould verified, using two identifiers, that Albert Gould am speaking with Albert Gould or their authorized healthcare agent. Albert Gould discussed the limitations, risks, security, and privacy concerns of performing an evaluation and management service by telephone and the potential availability of an in-person appointment in the future. The patient expressed understanding and agreed to proceed.  Location of Patient: Home Location of Provider (nurse):  In the office.  Subjective:    Albert Gould is a 46 y.o. male patient of Christen Butter, NP who had a [employer] Visit today via telephone. Albert Gould is Albert Gould. he has 0 children. he reports that he is socially active and does interact with friends/family regularly. he is minimally physically active and enjoys walking.  Patient Care Team: Christen Butter, NP as PCP - General (Nurse Practitioner)  Advanced Directives 09/23/2021  Does Patient Have a Medical Advance Directive? No  Would patient like information on creating a medical advance directive? No - Patient declined    Hospital Utilization Over the Past 12 Months: # of hospitalizations or ER visits: 0 # of surgeries: 0  Review of Systems    Patient reports that his overall health is unchanged compared to last year.  History obtained from chart review, the patient, and unobtainable from patient due to language barrier  Patient Reported Readings (BP, Pulse, CBG, Weight, etc) none  Pain Assessment Pain : No/denies pain     Current Medications & Allergies (verified) Allergies as of 09/23/2021   No Known Allergies      Medication List        Accurate as of September 23, 2021  2:46 PM. If you have any questions, ask your nurse  or doctor.          benztropine 1 MG tablet Commonly known as: COGENTIN Take 1 mg by mouth 2 (two) times daily.   divalproex 500 MG 24 hr tablet Commonly known as: DEPAKOTE ER Take 500 mg by mouth 2 (two) times daily.   Invega Trinza 819 MG/2.63ML Susy injection Generic drug: paliperidone Palmitate ER Inject into the muscle.   NEOMYCIN-POLYMYXIN-HYDROCORTISONE 1 % Soln OTIC solution Commonly known as: CORTISPORIN Place 3 drops into both ears 3 (three) times daily.        History (reviewed): Past Medical History:  Diagnosis Date   Anxiety    Depression    Mood disorder [facility])    Past Surgical History:  Procedure Laterality Date   APPENDECTOMY     Family History  Problem Relation Age of Onset   Healthy Mother    Healthy Father    Social History   Socioeconomic History   Marital status: Single    Spouse name: Not on file   Number of children: 0   Years of education: 9th grade   Highest education level: 9th grade  Occupational History   Occupation: Albert disabled.  Tobacco Use   Smoking status: Former    Types: Cigarettes   Smokeless tobacco: Never   Tobacco comments:    1 cig/week  Vaping Use   Vaping Use: Never used  Substance and Sexual Activity   Alcohol use: Never   Drug use: Never   Sexual activity: Not Currently  Other Topics Concern   Not on file  Social History Narrative   Lives  with his brother. He doesn't have any children. He enjoys walking.   Social Determinants of Health   Financial Resource Strain: Low Risk    Difficulty of Paying Living Expenses: Not hard at all  Food Insecurity: No Food Insecurity   Worried About Programme researcher, broadcasting/film/video in the Last Year: Never true   Ran Out of Food in the Last Year: Never true  Transportation Needs: No Transportation Needs   Lack of Transportation (Medical): No   Lack of Transportation (Non-Medical): No  Physical Activity: Inactive   Days of Exercise per Week: 0 days   Minutes of Exercise per  Session: 0 min  Stress: No Stress Concern Present   Feeling of Stress : Not at all  Social Connections: Socially Isolated   Frequency of Communication with Friends and Family: More than three times a week   Frequency of Social Gatherings with Friends and Family: More than three times a week   Attends Religious Services: Never   Database administrator or Organizations: No   Attends Banker Meetings: Never   Marital Status: Never married    Activities of Daily Living In your present state of health, do you have any difficulty performing the following activities: 09/23/2021  Hearing? N  Comment ear pain.  Vision? N  Difficulty concentrating or making decisions? Y  Comment some memory loss  Walking or climbing stairs? N  Dressing or bathing? N  Doing errands, shopping? Y  Comment he doesn't drive.  Preparing Food and eating ? N  Using the Toilet? N  In the past six months, have you accidently leaked urine? N  Do you have problems with loss of bowel control? N  Managing your Medications? N  Managing your Finances? Y  Comment his brother helps with the finances.  Housekeeping or managing your Housekeeping? N  Some recent data might be hidden    Patient Education/ Literacy How often do you need to have someone help you when you read instructions, pamphlets, or other written materials from your doctor or pharmacy?: 5 - Always What is the last grade level you completed in school?: 9th grade  Exercise Current Exercise Habits: Home exercise routine, Type of exercise: walking, Time (Minutes): 20, Frequency (Times/Week): 7, Weekly Exercise (Minutes/Week): 140, Intensity: Mild, Exercise limited by: None identified  Diet Patient reports consuming 2 meals a day and 1 snack(s) a day Patient reports that his primary diet is: Regular Patient reports that she does have regular access to food.   Depression Screen PHQ 2/9 Scores 09/23/2021 06/13/2020 06/13/2020 04/07/2019 12/10/2017   PHQ - 2 Score 0 0 0 0 0  PHQ- 9 Score - - - 2 2     Fall Risk Fall Risk  09/23/2021 06/13/2020 04/07/2019  Falls in the past year? 0 0 0  Number falls in past yr: 0 0 0  Injury with Fall? 0 0 0  Risk for fall due to : No Fall Risks - -  Follow up Falls evaluation completed Falls evaluation completed Falls evaluation completed     Objective:  Albert Gould seemed alert and oriented and he participated appropriately during our telephone visit.  Blood Pressure Weight BMI  BP Readings from Last 3 Encounters:  06/13/20 120/78  04/07/19 133/82  06/21/18 139/80   Wt Readings from Last 3 Encounters:  06/13/20 220 lb 6.4 oz (100 kg)  04/07/19 215 lb (97.5 kg)  06/21/18 217 lb (98.4 kg)   BMI Readings from Last 1 Encounters:  06/13/20 35.84 kg/m    *Unable to obtain current vital signs, weight, and BMI due to telephone visit type  Hearing/Vision  Albert Gould did not seem to have difficulty with hearing/understanding during the telephone conversation Reports that he has not had a formal eye exam by an eye care professional within the past year Reports that he has not had a formal hearing evaluation within the past year *Unable to fully assess hearing and vision during telephone visit type  Cognitive Function: 6CIT Screen 09/23/2021  What Year? 0 points  What month? 0 points  What time? 0 points  Count back from 20 0 points  Months in reverse 4 points  Repeat phrase 0 points  Total Score 4   (Normal:0-7, Significant for Dysfunction: >8)  Normal Cognitive Function Screening: Yes   Immunization & Health Maintenance Record Immunization History  Administered Date(s) Administered   Influenza,inj,Quad PF,6+ Mos 06/13/2020   Tdap 12/10/2017    Health Maintenance  Topic Date Due   COVID-19 Vaccine (1) 10/09/2021 (Originally 11/12/1976)   INFLUENZA VACCINE  11/29/2021 (Originally 04/01/2021)   TETANUS/TDAP  12/11/2027   Hepatitis C Screening  Completed   HIV Screening  Completed    HPV VACCINES  Aged Out       Assessment  This is a routine wellness examination for Albert Gould.  Health Maintenance: Due or Overdue There are no preventive care reminders to display for this patient.   Albert Gould does not need a referral for Community Assistance: Care Management:   no Social Work:    no Prescription Assistance:  no Nutrition/Diabetes Education:  no   Plan:  Personalized Goals  Goals Addressed               This Visit's Progress     Patient Stated (pt-stated)        Would like to be able to get more self-control.       Personalized Health Maintenance & Screening Recommendations  Influenza vaccine  Lung Cancer Screening Recommended: no (Low Dose CT Chest recommended if Age 37-80 years, 30 pack-year currently smoking OR have quit w/in past 15 years) Hepatitis C Screening recommended: no HIV Screening recommended: no  Advanced Directives: Written information was not prepared per patient's request.  Referrals & Orders No orders of the defined types were placed in this encounter.   Follow-up Plan Follow-up with Christen ButterJessup, Joy, NP as planned Flu shot can be done at your next in-office visit or at the pharmacy. Medicare wellness visit in one year.  AVS printed and mailed to the patient.   Albert Gould have personally reviewed and noted the following in the patients chart:   Medical and social history Use of alcohol, tobacco or illicit drugs  Current medications and supplements Functional ability and status Nutritional status Physical activity Advanced directives List of other physicians Hospitalizations, surgeries, and ER visits in previous 12 months Vitals Screenings to include cognitive, depression, and falls Referrals and appointments  In addition, Albert Gould have reviewed and discussed with Akhilesh Mcfall certain preventive protocols, quality metrics, and best practice recommendations. A written personalized care plan for preventive services as well as  general preventive health recommendations is available and can be mailed to the patient at his request.      [place] CharonBableen  Ryen Heitmeyer, RN  09/23/2021

## 2021-10-15 DIAGNOSIS — F250 Schizoaffective disorder, bipolar type: Secondary | ICD-10-CM | POA: Diagnosis not present

## 2021-11-05 ENCOUNTER — Ambulatory Visit: Payer: Medicare Other | Admitting: Medical-Surgical

## 2021-12-04 DIAGNOSIS — F250 Schizoaffective disorder, bipolar type: Secondary | ICD-10-CM | POA: Diagnosis not present

## 2022-03-05 DIAGNOSIS — F250 Schizoaffective disorder, bipolar type: Secondary | ICD-10-CM | POA: Diagnosis not present

## 2022-06-06 DIAGNOSIS — F250 Schizoaffective disorder, bipolar type: Secondary | ICD-10-CM | POA: Diagnosis not present

## 2022-09-12 DIAGNOSIS — F250 Schizoaffective disorder, bipolar type: Secondary | ICD-10-CM | POA: Diagnosis not present

## 2023-01-07 ENCOUNTER — Encounter: Payer: Medicare Other | Admitting: Medical-Surgical

## 2023-01-09 DIAGNOSIS — F250 Schizoaffective disorder, bipolar type: Secondary | ICD-10-CM | POA: Diagnosis not present

## 2023-01-12 ENCOUNTER — Encounter: Payer: Medicare Other | Admitting: Medical-Surgical

## 2023-01-21 ENCOUNTER — Telehealth: Payer: Self-pay | Admitting: Medical-Surgical

## 2023-01-21 DIAGNOSIS — I10 Essential (primary) hypertension: Secondary | ICD-10-CM

## 2023-01-21 NOTE — Telephone Encounter (Signed)
Patient's brother called to schedule an appt for physical for pt.  Physical has been scheduled for May 31. Pt would like to get bloodwork done before appointment.

## 2023-01-27 DIAGNOSIS — I10 Essential (primary) hypertension: Secondary | ICD-10-CM | POA: Diagnosis not present

## 2023-01-27 NOTE — Telephone Encounter (Signed)
Patient's brother advised. Cancelled physical. He will call back to schedule a Medicare Wellness visit. He did the labs this morning.

## 2023-01-28 LAB — LIPID PANEL
Cholesterol: 160 mg/dL (ref <200)
HDL: 45 mg/dL (ref >=40)
LDL Cholesterol (Calc): 89 mg/dL (calc)
Non-HDL Cholesterol (Calc): 115 mg/dL (calc) (ref <130)
Total CHOL/HDL Ratio: 3.6 (calc) (ref <5.0)
Triglycerides: 160 mg/dL — ABNORMAL HIGH (ref <150)

## 2023-01-28 LAB — CBC WITH DIFFERENTIAL/PLATELET
Absolute Monocytes: 631 cells/uL (ref 200–950)
Basophils Absolute: 61 cells/uL (ref 0–200)
Basophils Relative: 0.8 %
Eosinophils Absolute: 441 cells/uL (ref 15–500)
Eosinophils Relative: 5.8 %
HCT: 48.7 % (ref 38.5–50.0)
Hemoglobin: 16.5 g/dL (ref 13.2–17.1)
Lymphs Abs: 3549 cells/uL (ref 850–3900)
MCH: 27.4 pg (ref 27.0–33.0)
MCHC: 33.9 g/dL (ref 32.0–36.0)
MCV: 80.9 fL (ref 80.0–100.0)
MPV: 9.9 fL (ref 7.5–12.5)
Monocytes Relative: 8.3 %
Neutro Abs: 2918 cells/uL (ref 1500–7800)
Neutrophils Relative %: 38.4 %
Platelets: 258 10*3/uL (ref 140–400)
RBC: 6.02 10*6/uL — ABNORMAL HIGH (ref 4.20–5.80)
RDW: 14.4 % (ref 11.0–15.0)
Total Lymphocyte: 46.7 %
WBC: 7.6 10*3/uL (ref 3.8–10.8)

## 2023-01-28 LAB — COMPLETE METABOLIC PANEL WITH GFR
AG Ratio: 1.7 (calc) (ref 1.0–2.5)
ALT: 41 U/L (ref 9–46)
AST: 24 U/L (ref 10–40)
Albumin: 4.3 g/dL (ref 3.6–5.1)
Alkaline phosphatase (APISO): 65 U/L (ref 36–130)
BUN: 15 mg/dL (ref 7–25)
CO2: 23 mmol/L (ref 20–32)
Calcium: 9.1 mg/dL (ref 8.6–10.3)
Chloride: 107 mmol/L (ref 98–110)
Creat: 0.88 mg/dL (ref 0.60–1.29)
Globulin: 2.5 g/dL (calc) (ref 1.9–3.7)
Glucose, Bld: 98 mg/dL (ref 65–99)
Potassium: 4 mmol/L (ref 3.5–5.3)
Sodium: 140 mmol/L (ref 135–146)
Total Bilirubin: 0.4 mg/dL (ref 0.2–1.2)
Total Protein: 6.8 g/dL (ref 6.1–8.1)
eGFR: 108 mL/min/{1.73_m2} (ref >=60)

## 2023-01-30 ENCOUNTER — Encounter: Payer: Medicare Other | Admitting: Medical-Surgical

## 2023-02-03 ENCOUNTER — Encounter: Payer: Self-pay | Admitting: Medical-Surgical

## 2023-02-03 ENCOUNTER — Ambulatory Visit (INDEPENDENT_AMBULATORY_CARE_PROVIDER_SITE_OTHER): Payer: Medicare Other | Admitting: Medical-Surgical

## 2023-02-03 VITALS — BP 105/72 | HR 86 | Resp 20 | Ht 65.75 in | Wt 242.0 lb

## 2023-02-03 DIAGNOSIS — F259 Schizoaffective disorder, unspecified: Secondary | ICD-10-CM | POA: Diagnosis not present

## 2023-02-03 DIAGNOSIS — Z79899 Other long term (current) drug therapy: Secondary | ICD-10-CM | POA: Diagnosis not present

## 2023-02-03 DIAGNOSIS — I10 Essential (primary) hypertension: Secondary | ICD-10-CM | POA: Diagnosis not present

## 2023-02-03 NOTE — Progress Notes (Signed)
        Established patient visit  History, exam, impression, and plan:  1. Hypertension goal BP (blood pressure) < 130/80 Pleasant 47 year old male presenting today with history of hypertension.  He is not on any medications for management of blood pressure at this time.  Walks around his neighborhood occasionally for exercise.  Admits to eating quite a bit of junk food and that his diet needs some work.  Does not regularly check blood pressures at home.  Reports intermittent dizziness however this seems to be more related to anxiety.  Has occasional lower extremity edema when he has been on his feet a lot during the day.  See below for physical exam.  Recommend increasing regular intentional exercise and working on dietary improvements.  Would benefit from weight loss to healthy weight.  Blood pressure at goal today.  No medications indicated.  2. Schizoaffective disorder, unspecified type (HCC) 3. High risk medication use Managed by psychiatry but he is not sure of the provider he is seeing now.  Saw his psychiatrist approximately 1 month ago for his Invega injection.  Remembers that he was given papers for blood work but never went back and got those.  Since he is on Depakote, checking valproic acid level today.  Recommend he contact his psychiatrist and let them know that this was drawn at our practice and they should be able to find it in care everywhere. - Valproic Acid level  Procedures performed this visit: None.  Return in about 6 months (around 08/05/2023) for chronic disease follow up.  __________________________________ Thayer Ohm, DNP, APRN, FNP-BC Primary Care and Sports Medicine [facility] [place]

## 2023-02-04 LAB — VALPROIC ACID LEVEL: Valproic Acid Lvl: 71.2 mg/L (ref 50.0–100.0)

## 2023-04-03 DIAGNOSIS — F250 Schizoaffective disorder, bipolar type: Secondary | ICD-10-CM | POA: Diagnosis not present

## 2023-04-22 ENCOUNTER — Telehealth: Payer: Self-pay | Admitting: Medical-Surgical
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
## 2023-06-26 DIAGNOSIS — F250 Schizoaffective disorder, bipolar type: Secondary | ICD-10-CM | POA: Diagnosis not present

## 2023-08-05 ENCOUNTER — Ambulatory Visit: Payer: Medicare Other | Admitting: Medical-Surgical

## 2023-08-05 NOTE — Progress Notes (Unsigned)
        Established patient visit  History, exam, impression, and plan:  No problem-specific Assessment & Plan notes found for this encounter.  ROS  Physical Exam   Procedures performed this visit: None.  No follow-ups on file.  __________________________________ Thayer Ohm, DNP, APRN, FNP-BC Primary Care and Sports Medicine [facility] [place]

## 2024-09-22 ENCOUNTER — Encounter: Admitting: Medical-Surgical

## 2024-09-22 ENCOUNTER — Ambulatory Visit

## 2024-09-22 ENCOUNTER — Ambulatory Visit (INDEPENDENT_AMBULATORY_CARE_PROVIDER_SITE_OTHER)

## 2024-09-22 VITALS — BP 156/110 | HR 96 | Ht 65.75 in | Wt 257.0 lb

## 2024-09-22 DIAGNOSIS — Z0000 Encounter for general adult medical examination without abnormal findings: Secondary | ICD-10-CM

## 2024-09-22 DIAGNOSIS — Z1211 Encounter for screening for malignant neoplasm of colon: Secondary | ICD-10-CM

## 2024-09-22 NOTE — Patient Instructions (Signed)
 Mr. Ibsen,  Thank you for taking the time for your Medicare Wellness Visit. I appreciate your continued commitment to your health goals. Please review the care plan we discussed, and feel free to reach out if I can assist you further.  Please note that Annual Wellness Visits do not include a physical exam. Some assessments may be limited, especially if the visit was conducted virtually. If needed, we may recommend an in-person follow-up with your provider.  Ongoing Care Seeing your primary care provider every 3 to 6 months helps us  monitor your health and provide consistent, personalized care.   Referrals If a referral was made during today's visit and you haven't received any updates within two weeks, please contact the referred provider directly to check on the status.  Recommended Screenings:  Health Maintenance  Topic Date Due   Hepatitis B Vaccine (1 of 3 - 19+ 3-dose series) Never done   Colon Cancer Screening  Never done   Medicare Annual Wellness Visit  09/23/2022   COVID-19 Vaccine (1) 10/08/2024*   Flu Shot  11/29/2024*   DTaP/Tdap/Td vaccine (2 - Td or Tdap) 12/11/2027   HPV Vaccine (No Doses Required) Completed   Hepatitis C Screening  Completed   HIV Screening  Completed   Pneumococcal Vaccine  Aged Out   Meningitis B Vaccine  Aged Out  *Topic was postponed. The date shown is not the original due date.       09/22/2024    8:47 AM  Advanced Directives  Does Patient Have a Medical Advance Directive? No  Would patient like information on creating a medical advance directive? No - Patient declined    Vision: Annual vision screenings are recommended for early detection of glaucoma, cataracts, and diabetic retinopathy. These exams can also reveal signs of chronic conditions such as diabetes and high blood pressure.  Dental: Annual dental screenings help detect early signs of oral cancer, gum disease, and other conditions linked to overall health, including heart disease  and diabetes.  Please see the attached documents for additional preventive care recommendations.

## 2024-09-22 NOTE — Progress Notes (Signed)
 "  Chief Complaint  Patient presents with   Medicare Wellness     Subjective:   Albert Gould is a 49 y.o. male who presents for a Medicare Annual Wellness Visit.  Visit info / Clinical Intake: Medicare Wellness Visit Type:: Subsequent Annual Wellness Visit Persons participating in visit and providing information:: patient Medicare Wellness Visit Mode:: In-person (required for WTM) Interpreter Needed?: No Pre-visit prep was completed: yes AWV questionnaire completed by patient prior to visit?: no Living arrangements:: with family/others Patient's Overall Health Status Rating: good Typical amount of pain: some Does pain affect daily life?: no Are you currently prescribed opioids?: no  Dietary Habits and Nutritional Risks How many meals a day?: 2 Eats fruit and vegetables daily?: (!) no Most meals are obtained by: having others provide food In the last 2 weeks, have you had any of the following?: none Diabetic:: no  Functional Status Activities of Daily Living (to include ambulation/medication): Independent Ambulation: Independent Medication Administration: Independent Home Management (perform basic housework or laundry): Independent Manage your own finances?: (!) no Primary transportation is: family / friends Concerns about vision?: (!) yes Concerns about hearing?: no  Fall Screening Falls in the past year?: 0 Number of falls in past year: 0 Was there an injury with Fall?: 0 Fall Risk Category Calculator: 0 Patient Fall Risk Level: Low Fall Risk  Fall Risk Patient at Risk for Falls Due to: No Fall Risks Fall risk Follow up: Falls evaluation completed  Home and Transportation Safety: All rugs have non-skid backing?: N/A, no rugs All stairs or steps have railings?: yes Grab bars in the bathtub or shower?: yes Have non-skid surface in bathtub or shower?: yes Good home lighting?: yes Regular seat belt use?: yes Hospital stays in the last year:: no  Cognitive  Assessment Difficulty concentrating, remembering, or making decisions? : no Will 6CIT or Mini Cog be Completed: yes What year is it?: 4 points What month is it?: 0 points Give patient an address phrase to remember (5 components): [address] [place], [place] About what time is it?: 0 points Count backwards from 20 to 1: 0 points Say the months of the year in reverse: 4 points Repeat the address phrase from earlier: 0 points 6 CIT Score: 8 points  Advance Directives (For Healthcare) Does Patient Have a Medical Advance Directive?: No Would patient like information on creating a medical advance directive?: No - Patient declined  Reviewed/Updated  Reviewed/Updated: Medical History; Surgical History; Medications; Allergies; Care Teams; Patient Goals    Allergies (verified) Patient has no known allergies.   Current Medications (verified) Outpatient Encounter Medications as of 09/22/2024  Medication Sig   benztropine (COGENTIN) 1 MG tablet Take 1 mg by mouth 2 (two) times daily.   divalproex (DEPAKOTE ER) 500 MG 24 hr tablet Take 500 mg by mouth 2 (two) times daily.    INVEGA TRINZA 819 MG/2.63ML SUSY injection Inject into the muscle.   No facility-administered encounter medications on file as of 09/22/2024.    History: Past Medical History:  Diagnosis Date   Anxiety    Depression    Mood disorder    Past Surgical History:  Procedure Laterality Date   APPENDECTOMY     Family History  Problem Relation Age of Onset   Healthy Mother    Healthy Father    Social History   Occupational History   Occupation: Legally disabled.  Tobacco Use   Smoking status: Never   Smokeless tobacco: Never   Tobacco comments:  1 cig/week  Vaping Use   Vaping status: Never Used  Substance and Sexual Activity   Alcohol use: Never   Drug use: Never   Sexual activity: Not Currently   Tobacco Counseling Counseling given: Not Answered Tobacco comments: 1 cig/week  SDOH Screenings    Food Insecurity: No Food Insecurity (09/22/2024)  Housing: Low Risk (09/22/2024)  Transportation Needs: No Transportation Needs (09/22/2024)  Utilities: Not At Risk (09/22/2024)  Alcohol Screen: Low Risk (09/23/2021)  Depression (PHQ2-9): Low Risk (09/22/2024)  Financial Resource Strain: Low Risk (09/23/2021)  Physical Activity: Sufficiently Active (09/22/2024)  Social Connections: Socially Isolated (09/22/2024)  Stress: No Stress Concern Present (09/22/2024)  Tobacco Use: Low Risk (09/22/2024)  Health Literacy: Inadequate Health Literacy (09/22/2024)   See flowsheets for full screening details  Depression Screen PHQ 2 & 9 Depression Scale- Over the past 2 weeks, how often have you been bothered by any of the following problems? Little interest or pleasure in doing things: 1 Feeling down, depressed, or hopeless (PHQ Adolescent also includes...irritable): 0 PHQ-2 Total Score: 1     Goals Addressed             This Visit's Progress    Patient Stated       Patient states he would like to lose weight.              Objective:    Today's Vitals   09/22/24 0837  BP: (!) 156/110  Pulse: 96  SpO2: 96%  Weight: 257 lb (116.6 kg)  Height: 5' 5.75 (1.67 m)   Body mass index is 41.8 kg/m.  Hearing/Vision screen No results found. Immunizations and Health Maintenance Health Maintenance  Topic Date Due   Hepatitis B Vaccines 19-59 Average Risk (1 of 3 - 19+ 3-dose series) Never done   Colonoscopy  Never done   COVID-19 Vaccine (1) 10/08/2024 (Originally 11/12/1976)   Influenza Vaccine  11/29/2024 (Originally 04/01/2024)   Medicare Annual Wellness (AWV)  09/22/2025   DTaP/Tdap/Td (2 - Td or Tdap) 12/11/2027   HPV VACCINES (No Doses Required) Completed   Hepatitis C Screening  Completed   HIV Screening  Completed   Pneumococcal Vaccine  Aged Out   Meningococcal B Vaccine  Aged Out        Assessment/Plan:  This is a routine wellness examination for Albert Gould.  Patient Care  Team: Willo Mini, NP as PCP - General (Nurse Practitioner)  I have personally reviewed and noted the following in the patients chart:   Medical and social history Use of alcohol, tobacco or illicit drugs  Current medications and supplements including opioid prescriptions. Functional ability and status Nutritional status Physical activity Advanced directives List of other physicians Hospitalizations, surgeries, and ER visits in previous 12 months Vitals Screenings to include cognitive, depression, and falls Referrals and appointments  Orders Placed This Encounter  Procedures   Cologuard   In addition, I have reviewed and discussed with patient certain preventive protocols, quality metrics, and best practice recommendations. A written personalized care plan for preventive services as well as general preventive health recommendations were provided to patient.   Bonny Jon Gould, CMA   09/22/2024   Return in 1 year (on 09/22/2025).  After Visit Summary: (In Person-Declined) Patient declined AVS at this time.  Nurse Notes:   Albert Gould is a 49 y.o. male patient of Willo Mini, NP who had a [employer] Visit today. Albert Gould is Legally disabled and lives with his brother. He has 0 children. He reports that  he is socially active and does interact with friends/family regularly. He is minimally physically active and enjoys walking.     "

## 2024-10-04 ENCOUNTER — Ambulatory Visit: Admitting: Medical-Surgical

## 2024-10-06 ENCOUNTER — Ambulatory Visit: Admitting: Medical-Surgical

## 2024-10-06 ENCOUNTER — Encounter: Payer: Self-pay | Admitting: Medical-Surgical

## 2024-10-06 ENCOUNTER — Ambulatory Visit

## 2024-10-06 VITALS — BP 132/84 | HR 97 | Resp 20 | Ht 65.75 in | Wt 255.5 lb

## 2024-10-06 DIAGNOSIS — Z1211 Encounter for screening for malignant neoplasm of colon: Secondary | ICD-10-CM

## 2024-10-06 DIAGNOSIS — M545 Low back pain, unspecified: Secondary | ICD-10-CM

## 2024-10-06 MED ORDER — MELOXICAM 15 MG PO TABS: 15.0000 mg | ORAL_TABLET | Freq: Every day | ORAL | 0 refills | Status: AC

## 2024-10-06 NOTE — Patient Instructions (Signed)
" °  VISIT SUMMARY: Today, we discussed your chronic mid-back pain that has been ongoing for about a year. We also reviewed your functional limitations and psychiatric history.  YOUR PLAN: CHRONIC MIDLINE LOW BACK/SACRAL PAIN: You have been experiencing back/sacral pain for about a year, which worsens with cold weather and certain movements. There is no radiation to your legs, and Tylenol provides minimal relief. -We have ordered x-rays of your tailbone and low back to better understand the cause of your pain. -You have been prescribed meloxicam  15,mg to take once daily for four weeks to help manage the pain. Avoid all other over the counter pain medications but it is okay to take Tylenol 1000mg  every 8 hours as needed.  GENERAL HEALTH MAINTENANCE: You are due for a colonoscopy. -A referral for a colonoscopy has been ordered.    Contains text generated by [employer].   "

## 2024-10-06 NOTE — Progress Notes (Unsigned)
" ° °       Established patient visit   History of Present Illness   Discussed the use of AI scribe software for clinical note transcription with the patient, who gave verbal consent to proceed.  History of Present Illness   Albert Gould is a 49 year old male who presents with chronic back pain.  Chronic mid-back pain - Mid-back pain present for approximately one year - No clear inciting injury - Pain is sometimes sharp in quality - Worsens with cold weather - No radiation to the legs - Does not worsen after sleeping - Tylenol provides minimal relief - No prior imaging performed  Functional limitations - Pain sometimes limits activity - Two days ago, experienced sensation of whole body heaviness and difficulty moving - Currently not working due to symptoms - Desires to return to at least part-time work  Psychiatric history and medications - Under psychiatric care - Takes Depakote and Cogentin - Continues follow-up with psychiatry team      Physical Exam   Physical Exam Vitals reviewed.  Constitutional:      General: He is not in acute distress.    Appearance: Normal appearance. He is obese. He is not ill-appearing.  HENT:     Head: Normocephalic and atraumatic.  Cardiovascular:     Rate and Rhythm: Normal rate and regular rhythm.     Pulses: Normal pulses.     Heart sounds: Normal heart sounds. No murmur heard.    No friction rub. No gallop.  Pulmonary:     Effort: Pulmonary effort is normal. No respiratory distress.     Breath sounds: Normal breath sounds.  Skin:    General: Skin is warm and dry.  Neurological:     Mental Status: He is alert and oriented to person, place, and time.  Psychiatric:        Mood and Affect: Mood normal.        Behavior: Behavior normal.        Thought Content: Thought content normal.        Judgment: Judgment normal.    Assessment & Plan   Assessment and Plan    Low back pain Chronic low back pain for one year, worsened by  cold weather and certain movements, no leg radiation, no prior imaging. - Ordered x-rays of the tailbone and low back. - Prescribed meloxicam  once daily for four weeks.  General Health Maintenance - Encourage to complete Cologuard, kit at home.      Follow up   Return in about 4 weeks (around 11/03/2024) for back/sacral pain follow up . __________________________________ Zada FREDRIK Palin, DNP, APRN, FNP-BC Primary Care and Sports Medicine [facility] [place] "

## 2025-09-26 ENCOUNTER — Ambulatory Visit
